# Patient Record
Sex: Male | Born: 1960 | Race: White | Hispanic: No | Marital: Married | Smoking: Never smoker
Health system: Southern US, Community
[De-identification: ages and names within clinical notes are randomized; demographics above are authoritative.]

## PROBLEM LIST (undated history)

## (undated) DIAGNOSIS — F329 Major depressive disorder, single episode, unspecified: Secondary | ICD-10-CM

## (undated) DIAGNOSIS — F32A Depression, unspecified: Secondary | ICD-10-CM

---

## 2006-07-18 ENCOUNTER — Ambulatory Visit (HOSPITAL_COMMUNITY): Admission: RE | Admit: 2006-07-18 | Discharge: 2006-07-18 | Payer: Self-pay | Admitting: General Surgery

## 2006-07-18 HISTORY — PX: UMBILICAL HERNIA REPAIR: SHX196

## 2007-02-21 ENCOUNTER — Emergency Department (HOSPITAL_COMMUNITY): Admission: EM | Admit: 2007-02-21 | Discharge: 2007-02-21 | Payer: Self-pay | Admitting: Family Medicine

## 2010-10-08 NOTE — Op Note (Signed)
NAMERECO, SHONK             ACCOUNT NO.:  [id number]   MEDICAL RECORD NO.:  [id number]          PATIENT TYPE:  AMB   LOCATION:  SDS                          FACILITY:  [facility]   PHYSICIAN:  Angelia Mould. [name], M.D.DATE OF BIRTH:  1960/10/19   DATE OF PROCEDURE:  07/18/2006  DATE OF DISCHARGE:                               OPERATIVE REPORT   PREOPERATIVE DIAGNOSIS:  Umbilical hernia.   POSTOPERATIVE DIAGNOSIS:  Umbilical hernia.   OPERATION PERFORMED:  Umbilical herniorrhaphy.   SURGEON:  Angelia Mould. [name], M.D.   OPERATIVE INDICATIONS:  This is a 50 year old white male who has noticed  a bulge and some discomfort in his umbilicus for about two months.  This  been no history of incarceration.  This has been bothering him more and  he desires to have this repaired.  On exam he has an umbilical hernia  with protrusion at the superior rim of the umbilicus.  I can not palpate  the defect.  It is a little bit too tender to completely reduce.  He is  brought to operating room electively.   OPERATIVE TECHNIQUE:  Following induction of a general LMA anesthetic,  the patient's abdomen was prepped and draped in a sterile fashion.  The  patient was identified as to correct patient and correct procedure.  Intravenous antibiotics were given prior to the incision.  0.5% Marcaine  with epinephrine local infiltration anesthetic.  A curved transverse  incision was made at the lower rim of the umbilicus.  Dissection was  carried down through subcutaneous tissue.  We dissected a very large  hernia sac away from the umbilical skin.  We dissected the fascia  circumferentially and define the edges of the fascia.  We dissected out  the hernia sac and opened this up and found it to be empty.  There is a  lot of fatty tissue around the hernia sac which we carefully debrided  with electrocautery.  We then could completely define the edges of the  fascial defect and it was no more than about 2-2.5 cm.   The fascia was  otherwise fairly sturdy.  We closed the fascia with interrupted sutures  of #1 Novofil.  At the corners simple sutures were placed and centrally  we placed two vest over pants sutures.  After all the sutures were  placed, we then lifted up on all the sutures to overlap the fascia and  tied all the sutures down.  This provided very secure overlapping  repair.  The wound was irrigated with saline.  Hemostasis was excellent.  The umbilical skin was tacked back to the abdominal wall fascia with 3-0  Vicryl suture.  Subcutaneous tissue was closed with interrupted sutures  of 3-0 Vicryl and skin closed with a running subcuticular suture of 4-0  Monocryl and Steri-Strips.  Clean bandages were placed and the patient  taken recovery room in stable condition.  Estimated blood loss was about  10-15 mL.  Complications none.  Sponge, needle and instrument counts  were correct.     Angelia Mould. [name], M.D.  Electronically Signed    HMI/MEDQ  D:  07/18/2006  T:  07/18/2006  Job:  [id number]   cc:   Theressa Millard, M.D.

## 2014-03-05 ENCOUNTER — Encounter (HOSPITAL_COMMUNITY): Payer: Self-pay

## 2014-03-05 ENCOUNTER — Emergency Department (HOSPITAL_COMMUNITY): Payer: BC Managed Care – PPO

## 2014-03-05 ENCOUNTER — Observation Stay (HOSPITAL_COMMUNITY): Payer: BC Managed Care – PPO | Admitting: Registered Nurse

## 2014-03-05 ENCOUNTER — Encounter (HOSPITAL_COMMUNITY): Payer: BC Managed Care – PPO | Admitting: Registered Nurse

## 2014-03-05 ENCOUNTER — Encounter (HOSPITAL_COMMUNITY): Admission: EM | Disposition: A | Discharge status: Home / Self Care | Payer: Self-pay | Source: Home / Self Care

## 2014-03-05 ENCOUNTER — Inpatient Hospital Stay (HOSPITAL_COMMUNITY)
Admission: EM | Admit: 2014-03-05 | Discharge: 2014-03-13 | DRG: 338 | Disposition: A | Discharge status: Home / Self Care | Payer: BC Managed Care – PPO | Attending: Surgery | Admitting: Surgery

## 2014-03-05 DIAGNOSIS — K567 Ileus, unspecified: Secondary | ICD-10-CM

## 2014-03-05 DIAGNOSIS — I471 Supraventricular tachycardia: Secondary | ICD-10-CM | POA: Diagnosis not present

## 2014-03-05 DIAGNOSIS — K352 Acute appendicitis with generalized peritonitis: Secondary | ICD-10-CM | POA: Diagnosis not present

## 2014-03-05 DIAGNOSIS — R069 Unspecified abnormalities of breathing: Secondary | ICD-10-CM

## 2014-03-05 DIAGNOSIS — M179 Osteoarthritis of knee, unspecified: Secondary | ICD-10-CM | POA: Diagnosis present

## 2014-03-05 DIAGNOSIS — E669 Obesity, unspecified: Secondary | ICD-10-CM

## 2014-03-05 DIAGNOSIS — J9690 Respiratory failure, unspecified, unspecified whether with hypoxia or hypercapnia: Secondary | ICD-10-CM

## 2014-03-05 DIAGNOSIS — K913 Postprocedural intestinal obstruction: Secondary | ICD-10-CM | POA: Diagnosis not present

## 2014-03-05 DIAGNOSIS — G4733 Obstructive sleep apnea (adult) (pediatric): Secondary | ICD-10-CM | POA: Diagnosis present

## 2014-03-05 DIAGNOSIS — Y839 Surgical procedure, unspecified as the cause of abnormal reaction of the patient, or of later complication, without mention of misadventure at the time of the procedure: Secondary | ICD-10-CM | POA: Diagnosis not present

## 2014-03-05 DIAGNOSIS — I4891 Unspecified atrial fibrillation: Secondary | ICD-10-CM | POA: Diagnosis not present

## 2014-03-05 DIAGNOSIS — M7989 Other specified soft tissue disorders: Secondary | ICD-10-CM | POA: Diagnosis present

## 2014-03-05 DIAGNOSIS — K3532 Acute appendicitis with perforation and localized peritonitis, without abscess: Secondary | ICD-10-CM

## 2014-03-05 DIAGNOSIS — R0902 Hypoxemia: Secondary | ICD-10-CM

## 2014-03-05 DIAGNOSIS — R1031 Right lower quadrant pain: Secondary | ICD-10-CM | POA: Diagnosis not present

## 2014-03-05 DIAGNOSIS — Z6835 Body mass index (BMI) 35.0-35.9, adult: Secondary | ICD-10-CM

## 2014-03-05 DIAGNOSIS — F329 Major depressive disorder, single episode, unspecified: Secondary | ICD-10-CM | POA: Diagnosis present

## 2014-03-05 DIAGNOSIS — K3589 Other acute appendicitis without perforation or gangrene: Secondary | ICD-10-CM

## 2014-03-05 DIAGNOSIS — J9601 Acute respiratory failure with hypoxia: Secondary | ICD-10-CM

## 2014-03-05 DIAGNOSIS — Z881 Allergy status to other antibiotic agents status: Secondary | ICD-10-CM

## 2014-03-05 DIAGNOSIS — K9189 Other postprocedural complications and disorders of digestive system: Secondary | ICD-10-CM

## 2014-03-05 DIAGNOSIS — R609 Edema, unspecified: Secondary | ICD-10-CM

## 2014-03-05 DIAGNOSIS — R0602 Shortness of breath: Secondary | ICD-10-CM

## 2014-03-05 DIAGNOSIS — J9811 Atelectasis: Secondary | ICD-10-CM

## 2014-03-05 DIAGNOSIS — Q430 Meckel's diverticulum (displaced) (hypertrophic): Secondary | ICD-10-CM

## 2014-03-05 DIAGNOSIS — E876 Hypokalemia: Secondary | ICD-10-CM | POA: Diagnosis not present

## 2014-03-05 DIAGNOSIS — K3580 Unspecified acute appendicitis: Secondary | ICD-10-CM | POA: Diagnosis present

## 2014-03-05 DIAGNOSIS — J9600 Acute respiratory failure, unspecified whether with hypoxia or hypercapnia: Secondary | ICD-10-CM

## 2014-03-05 HISTORY — PX: LAPAROSCOPIC APPENDECTOMY: SHX408

## 2014-03-05 HISTORY — DX: Major depressive disorder, single episode, unspecified: F32.9

## 2014-03-05 HISTORY — DX: Depression, unspecified: F32.A

## 2014-03-05 LAB — CBC WITH DIFFERENTIAL/PLATELET
BASOS ABS: 0 10*3/uL (ref 0.0–0.1)
BASOS PCT: 0 % (ref 0–1)
EOS ABS: 0 10*3/uL (ref 0.0–0.7)
Eosinophils Relative: 0 % (ref 0–5)
HEMATOCRIT: 42.6 % (ref 39.0–52.0)
Hemoglobin: 15.2 g/dL (ref 13.0–17.0)
LYMPHS ABS: 0.9 10*3/uL (ref 0.7–4.0)
LYMPHS PCT: 5 % — AB (ref 12–46)
MCH: 31.8 pg (ref 26.0–34.0)
MCHC: 35.7 g/dL (ref 30.0–36.0)
MCV: 89.1 fL (ref 78.0–100.0)
Monocytes Absolute: 1.4 10*3/uL — ABNORMAL HIGH (ref 0.1–1.0)
Monocytes Relative: 7 % (ref 3–12)
NEUTROS ABS: 16.4 10*3/uL — AB (ref 1.7–7.7)
Neutrophils Relative %: 88 % — ABNORMAL HIGH (ref 43–77)
Platelets: 287 10*3/uL (ref 150–400)
RBC: 4.78 MIL/uL (ref 4.22–5.81)
RDW: 12.2 % (ref 11.5–15.5)
WBC: 18.6 10*3/uL — ABNORMAL HIGH (ref 4.0–10.5)

## 2014-03-05 LAB — URINALYSIS, ROUTINE W REFLEX MICROSCOPIC
BILIRUBIN URINE: NEGATIVE
GLUCOSE, UA: NEGATIVE mg/dL
HGB URINE DIPSTICK: NEGATIVE
Ketones, ur: NEGATIVE mg/dL
Leukocytes, UA: NEGATIVE
NITRITE: NEGATIVE
PH: 5.5 (ref 5.0–8.0)
PROTEIN: NEGATIVE mg/dL
Specific Gravity, Urine: >1.046 — ABNORMAL HIGH (ref 1.005–1.030)
UROBILINOGEN UA: 0.2 mg/dL (ref 0.0–1.0)

## 2014-03-05 LAB — COMPREHENSIVE METABOLIC PANEL
ALT: 30 U/L (ref 0–53)
ANION GAP: 17 — AB (ref 5–15)
AST: 19 U/L (ref 0–37)
Albumin: 4.2 g/dL (ref 3.5–5.2)
Alkaline Phosphatase: 98 U/L (ref 39–117)
BILIRUBIN TOTAL: 0.9 mg/dL (ref 0.3–1.2)
BUN: 13 mg/dL (ref 6–23)
CALCIUM: 9.3 mg/dL (ref 8.4–10.5)
CHLORIDE: 101 meq/L (ref 96–112)
CO2: 23 mEq/L (ref 19–32)
CREATININE: 1.02 mg/dL (ref 0.50–1.35)
GFR calc Af Amer: >90 mL/min (ref >90)
GFR calc non Af Amer: 83 mL/min — ABNORMAL LOW (ref >90)
GLUCOSE: 172 mg/dL — AB (ref 70–99)
POTASSIUM: 4.3 meq/L (ref 3.7–5.3)
SODIUM: 141 meq/L (ref 137–147)
Total Protein: 8.2 g/dL (ref 6.0–8.3)

## 2014-03-05 LAB — LIPASE, BLOOD: LIPASE: 23 U/L (ref 11–59)

## 2014-03-05 SURGERY — APPENDECTOMY, LAPAROSCOPIC
Anesthesia: General | Site: Abdomen

## 2014-03-05 MED ORDER — LIDOCAINE HCL (CARDIAC) 20 MG/ML IV SOLN
INTRAVENOUS | Status: DC | PRN
  Administered 2014-03-05: 100 mg via INTRAVENOUS

## 2014-03-05 MED ORDER — SODIUM CHLORIDE 0.9 % IJ SOLN
INTRAMUSCULAR | Status: AC
  Administered 2014-03-05: 12:00:00
  Filled 2014-03-05: qty 3

## 2014-03-05 MED ORDER — METOPROLOL TARTRATE 1 MG/ML IV SOLN
5.0000 mg | Freq: Four times a day (QID) | INTRAVENOUS | Status: DC | PRN
  Administered 2014-03-10: 5 mg via INTRAVENOUS
  Filled 2014-03-05 (×3): qty 5

## 2014-03-05 MED ORDER — BUPIVACAINE-EPINEPHRINE 0.25% -1:200000 IJ SOLN
INTRAMUSCULAR | Status: AC
  Filled 2014-03-05: qty 1

## 2014-03-05 MED ORDER — IOHEXOL 300 MG/ML  SOLN
50.0000 mL | Freq: Once | INTRAMUSCULAR | Status: AC | PRN
  Administered 2014-03-05: 50 mL via ORAL

## 2014-03-05 MED ORDER — FENTANYL CITRATE 0.05 MG/ML IJ SOLN
INTRAMUSCULAR | Status: AC
  Administered 2014-03-05: 11:00:00
  Filled 2014-03-05: qty 2

## 2014-03-05 MED ORDER — ONDANSETRON HCL 4 MG/2ML IJ SOLN
4.0000 mg | Freq: Once | INTRAMUSCULAR | Status: AC
  Administered 2014-03-05: 4 mg via INTRAVENOUS
  Filled 2014-03-05: qty 2

## 2014-03-05 MED ORDER — IOHEXOL 300 MG/ML  SOLN
100.0000 mL | Freq: Once | INTRAMUSCULAR | Status: AC | PRN
  Administered 2014-03-05: 100 mL via INTRAVENOUS

## 2014-03-05 MED ORDER — FENTANYL CITRATE 0.05 MG/ML IJ SOLN
INTRAMUSCULAR | Status: AC
  Filled 2014-03-05: qty 5

## 2014-03-05 MED ORDER — PIPERACILLIN-TAZOBACTAM 3.375 G IVPB: 3.3750 g | Freq: Four times a day (QID) | INTRAVENOUS | Status: DC

## 2014-03-05 MED ORDER — FENTANYL CITRATE 0.05 MG/ML IJ SOLN
25.0000 ug | INTRAMUSCULAR | Status: DC | PRN
  Administered 2014-03-05 (×3): 25 ug via INTRAVENOUS

## 2014-03-05 MED ORDER — HYDROCODONE-ACETAMINOPHEN 5-325 MG PO TABS: 1.0000 | ORAL_TABLET | ORAL | Status: DC | PRN

## 2014-03-05 MED ORDER — LIP MEDEX EX OINT
1.0000 "application " | TOPICAL_OINTMENT | Freq: Two times a day (BID) | CUTANEOUS | Status: DC
  Administered 2014-03-05 – 2014-03-12 (×15): 1 via TOPICAL
  Filled 2014-03-05 (×5): qty 7

## 2014-03-05 MED ORDER — MENTHOL 3 MG MT LOZG: 1.0000 | LOZENGE | OROMUCOSAL | Status: DC | PRN

## 2014-03-05 MED ORDER — ACETAMINOPHEN 325 MG PO TABS
ORAL_TABLET | ORAL | Status: AC
  Filled 2014-03-05: qty 2

## 2014-03-05 MED ORDER — PIPERACILLIN-TAZOBACTAM 3.375 G IVPB
3.3750 g | Freq: Once | INTRAVENOUS | Status: AC
  Administered 2014-03-05: 3.375 g via INTRAVENOUS
  Filled 2014-03-05: qty 50

## 2014-03-05 MED ORDER — PROMETHAZINE HCL 25 MG/ML IJ SOLN: 6.2500 mg | INTRAMUSCULAR | Status: DC | PRN

## 2014-03-05 MED ORDER — HYDROMORPHONE HCL 1 MG/ML IJ SOLN
0.5000 mg | INTRAMUSCULAR | Status: DC | PRN
  Administered 2014-03-05 – 2014-03-07 (×6): 1 mg via INTRAVENOUS
  Administered 2014-03-07: 2 mg via INTRAVENOUS
  Administered 2014-03-07: 1 mg via INTRAVENOUS
  Administered 2014-03-07: 2 mg via INTRAVENOUS
  Administered 2014-03-08: 1 mg via INTRAVENOUS
  Administered 2014-03-09 (×2): 2 mg via INTRAVENOUS
  Administered 2014-03-09 – 2014-03-10 (×2): 1 mg via INTRAVENOUS
  Filled 2014-03-05 (×5): qty 1
  Filled 2014-03-05 (×2): qty 2
  Filled 2014-03-05 (×2): qty 1
  Filled 2014-03-05: qty 2
  Filled 2014-03-05: qty 1
  Filled 2014-03-05 (×2): qty 2
  Filled 2014-03-05: qty 1

## 2014-03-05 MED ORDER — ROCURONIUM BROMIDE 100 MG/10ML IV SOLN
INTRAVENOUS | Status: DC | PRN
  Administered 2014-03-05: 10 mg via INTRAVENOUS
  Administered 2014-03-05: 40 mg via INTRAVENOUS

## 2014-03-05 MED ORDER — ONDANSETRON HCL 4 MG/2ML IJ SOLN
INTRAMUSCULAR | Status: DC | PRN
  Administered 2014-03-05: 4 mg via INTRAVENOUS

## 2014-03-05 MED ORDER — LACTATED RINGERS IV SOLN: INTRAVENOUS | Status: DC

## 2014-03-05 MED ORDER — PROPOFOL 10 MG/ML IV BOLUS
INTRAVENOUS | Status: AC
  Filled 2014-03-05: qty 20

## 2014-03-05 MED ORDER — OXYCODONE HCL 5 MG PO TABS: 5.0000 mg | ORAL_TABLET | ORAL | Status: DC | PRN

## 2014-03-05 MED ORDER — PROMETHAZINE HCL 25 MG/ML IJ SOLN
6.2500 mg | INTRAMUSCULAR | Status: DC | PRN
  Administered 2014-03-06: 6.25 mg via INTRAVENOUS
  Administered 2014-03-09 (×2): 12.5 mg via INTRAVENOUS
  Filled 2014-03-05 (×4): qty 1

## 2014-03-05 MED ORDER — DIPHENHYDRAMINE HCL 50 MG PO CAPS
50.0000 mg | ORAL_CAPSULE | Freq: Every evening | ORAL | Status: DC | PRN
  Administered 2014-03-06: 50 mg via ORAL
  Filled 2014-03-05: qty 1

## 2014-03-05 MED ORDER — GLYCOPYRROLATE 0.2 MG/ML IJ SOLN
INTRAMUSCULAR | Status: DC | PRN
  Administered 2014-03-05: .8 mg via INTRAVENOUS

## 2014-03-05 MED ORDER — MAGIC MOUTHWASH: 15.0000 mL | Freq: Four times a day (QID) | ORAL | Status: DC | PRN

## 2014-03-05 MED ORDER — ATROPINE SULFATE 0.4 MG/ML IJ SOLN
INTRAMUSCULAR | Status: AC
  Filled 2014-03-05: qty 2

## 2014-03-05 MED ORDER — SODIUM CHLORIDE 0.9 % IV BOLUS (SEPSIS)
1000.0000 mL | Freq: Once | INTRAVENOUS | Status: AC
  Administered 2014-03-05: 1000 mL via INTRAVENOUS

## 2014-03-05 MED ORDER — ALUM & MAG HYDROXIDE-SIMETH 200-200-20 MG/5ML PO SUSP: 30.0000 mL | Freq: Four times a day (QID) | ORAL | Status: DC | PRN

## 2014-03-05 MED ORDER — ACETAMINOPHEN 650 MG RE SUPP: 650.0000 mg | Freq: Four times a day (QID) | RECTAL | Status: DC | PRN

## 2014-03-05 MED ORDER — SUCCINYLCHOLINE CHLORIDE 20 MG/ML IJ SOLN
INTRAMUSCULAR | Status: DC | PRN
  Administered 2014-03-05: 100 mg via INTRAVENOUS

## 2014-03-05 MED ORDER — LACTATED RINGERS IV SOLN
INTRAVENOUS | Status: DC | PRN
  Administered 2014-03-05: 09:00:00 via INTRAVENOUS

## 2014-03-05 MED ORDER — NEOSTIGMINE METHYLSULFATE 10 MG/10ML IV SOLN
INTRAVENOUS | Status: AC
  Filled 2014-03-05: qty 1

## 2014-03-05 MED ORDER — DIPHENHYDRAMINE HCL 50 MG/ML IJ SOLN: 12.5000 mg | Freq: Four times a day (QID) | INTRAMUSCULAR | Status: DC | PRN

## 2014-03-05 MED ORDER — BUPIVACAINE-EPINEPHRINE 0.25% -1:200000 IJ SOLN
INTRAMUSCULAR | Status: DC | PRN
  Administered 2014-03-05: 50 mL

## 2014-03-05 MED ORDER — MORPHINE SULFATE 4 MG/ML IJ SOLN
4.0000 mg | Freq: Once | INTRAMUSCULAR | Status: AC
  Administered 2014-03-05: 4 mg via INTRAVENOUS
  Filled 2014-03-05: qty 1

## 2014-03-05 MED ORDER — SODIUM CHLORIDE 0.9 % IJ SOLN
INTRAMUSCULAR | Status: AC
  Filled 2014-03-05: qty 10

## 2014-03-05 MED ORDER — ROCURONIUM BROMIDE 100 MG/10ML IV SOLN
INTRAVENOUS | Status: AC
  Filled 2014-03-05: qty 1

## 2014-03-05 MED ORDER — KETOROLAC TROMETHAMINE 30 MG/ML IJ SOLN
INTRAMUSCULAR | Status: AC
  Filled 2014-03-05: qty 1

## 2014-03-05 MED ORDER — POTASSIUM CHLORIDE IN NACL 20-0.9 MEQ/L-% IV SOLN
INTRAVENOUS | Status: DC
  Administered 2014-03-05 – 2014-03-07 (×3): via INTRAVENOUS
  Administered 2014-03-07: 10 mL/h via INTRAVENOUS
  Administered 2014-03-09 – 2014-03-12 (×2): via INTRAVENOUS
  Filled 2014-03-05 (×6): qty 1000

## 2014-03-05 MED ORDER — DIPHENHYDRAMINE HCL 12.5 MG/5ML PO ELIX: 12.5000 mg | ORAL_SOLUTION | Freq: Four times a day (QID) | ORAL | Status: DC | PRN

## 2014-03-05 MED ORDER — NEOSTIGMINE METHYLSULFATE 10 MG/10ML IV SOLN
INTRAVENOUS | Status: DC | PRN
  Administered 2014-03-05: 5 mg via INTRAVENOUS

## 2014-03-05 MED ORDER — MIDAZOLAM HCL 5 MG/5ML IJ SOLN
INTRAMUSCULAR | Status: DC | PRN
  Administered 2014-03-05: 1 mg via INTRAVENOUS

## 2014-03-05 MED ORDER — FENTANYL CITRATE 0.05 MG/ML IJ SOLN
INTRAMUSCULAR | Status: AC
  Filled 2014-03-05: qty 2

## 2014-03-05 MED ORDER — PHENYLEPHRINE HCL 10 MG/ML IJ SOLN
INTRAMUSCULAR | Status: DC | PRN
  Administered 2014-03-05 (×2): 80 ug via INTRAVENOUS
  Administered 2014-03-05: 40 ug via INTRAVENOUS

## 2014-03-05 MED ORDER — CITALOPRAM HYDROBROMIDE 20 MG PO TABS: 20.0000 mg | ORAL_TABLET | Freq: Every day | ORAL | Status: DC

## 2014-03-05 MED ORDER — METRONIDAZOLE IN NACL 5-0.79 MG/ML-% IV SOLN
500.0000 mg | Freq: Four times a day (QID) | INTRAVENOUS | Status: DC
  Administered 2014-03-05 – 2014-03-13 (×32): 500 mg via INTRAVENOUS
  Filled 2014-03-05 (×36): qty 100

## 2014-03-05 MED ORDER — KETOROLAC TROMETHAMINE 30 MG/ML IJ SOLN
INTRAMUSCULAR | Status: DC | PRN
  Administered 2014-03-05: 30 mg via INTRAVENOUS

## 2014-03-05 MED ORDER — PHENOL 1.4 % MT LIQD: 2.0000 | OROMUCOSAL | Status: DC | PRN

## 2014-03-05 MED ORDER — LACTATED RINGERS IV BOLUS (SEPSIS): 1000.0000 mL | Freq: Three times a day (TID) | INTRAVENOUS | Status: AC | PRN

## 2014-03-05 MED ORDER — GLYCOPYRROLATE 0.2 MG/ML IJ SOLN
INTRAMUSCULAR | Status: AC
  Filled 2014-03-05: qty 4

## 2014-03-05 MED ORDER — ALBUTEROL SULFATE (2.5 MG/3ML) 0.083% IN NEBU
INHALATION_SOLUTION | RESPIRATORY_TRACT | Status: AC
  Filled 2014-03-05: qty 3

## 2014-03-05 MED ORDER — IBUPROFEN-DIPHENHYDRAMINE HCL 200-25 MG PO CAPS
2.0000 | ORAL_CAPSULE | Freq: Every evening | ORAL | Status: DC | PRN
Start: 2014-03-05 — End: 2014-03-05

## 2014-03-05 MED ORDER — ONDANSETRON HCL 4 MG/2ML IJ SOLN
INTRAMUSCULAR | Status: AC
  Filled 2014-03-05: qty 2

## 2014-03-05 MED ORDER — FENTANYL CITRATE 0.05 MG/ML IJ SOLN
INTRAMUSCULAR | Status: DC | PRN
  Administered 2014-03-05: 100 ug via INTRAVENOUS
  Administered 2014-03-05 (×4): 50 ug via INTRAVENOUS

## 2014-03-05 MED ORDER — MORPHINE SULFATE 2 MG/ML IJ SOLN
1.0000 mg | INTRAMUSCULAR | Status: DC | PRN
  Administered 2014-03-08 (×2): 4 mg via INTRAVENOUS
  Administered 2014-03-08: 2 mg via INTRAVENOUS
  Filled 2014-03-05 (×3): qty 2
  Filled 2014-03-05: qty 1

## 2014-03-05 MED ORDER — DEXTROSE 5 % IV SOLN
2.0000 g | INTRAVENOUS | Status: DC
  Administered 2014-03-05 – 2014-03-12 (×8): 2 g via INTRAVENOUS
  Filled 2014-03-05 (×10): qty 2

## 2014-03-05 MED ORDER — IBUPROFEN 200 MG PO TABS: 400.0000 mg | ORAL_TABLET | Freq: Four times a day (QID) | ORAL | Status: DC | PRN

## 2014-03-05 MED ORDER — LIDOCAINE HCL (CARDIAC) 20 MG/ML IV SOLN
INTRAVENOUS | Status: AC
  Filled 2014-03-05: qty 5

## 2014-03-05 MED ORDER — ACETAMINOPHEN 325 MG PO TABS
325.0000 mg | ORAL_TABLET | Freq: Four times a day (QID) | ORAL | Status: DC | PRN
  Administered 2014-03-05: 325 mg via ORAL

## 2014-03-05 MED ORDER — PHENYLEPHRINE 40 MCG/ML (10ML) SYRINGE FOR IV PUSH (FOR BLOOD PRESSURE SUPPORT)
PREFILLED_SYRINGE | INTRAVENOUS | Status: AC
  Filled 2014-03-05: qty 10

## 2014-03-05 MED ORDER — ALBUTEROL SULFATE (2.5 MG/3ML) 0.083% IN NEBU
2.5000 mg | INHALATION_SOLUTION | Freq: Four times a day (QID) | RESPIRATORY_TRACT | Status: DC | PRN
  Administered 2014-03-05: 2.5 mg via RESPIRATORY_TRACT

## 2014-03-05 MED ORDER — AMOXICILLIN-POT CLAVULANATE 875-125 MG PO TABS: 1.0000 | ORAL_TABLET | Freq: Two times a day (BID) | ORAL | Status: DC

## 2014-03-05 MED ORDER — LACTATED RINGERS IR SOLN
Status: DC | PRN
  Administered 2014-03-05 (×2): 3000 mL

## 2014-03-05 MED ORDER — BISACODYL 10 MG RE SUPP: 10.0000 mg | Freq: Two times a day (BID) | RECTAL | Status: DC | PRN

## 2014-03-05 MED ORDER — MEPERIDINE HCL 50 MG/ML IJ SOLN: 6.2500 mg | INTRAMUSCULAR | Status: DC | PRN

## 2014-03-05 MED ORDER — ONDANSETRON HCL 4 MG/2ML IJ SOLN
4.0000 mg | Freq: Four times a day (QID) | INTRAMUSCULAR | Status: DC | PRN
  Administered 2014-03-05 – 2014-03-11 (×7): 4 mg via INTRAVENOUS
  Filled 2014-03-05 (×7): qty 2

## 2014-03-05 MED ORDER — EPHEDRINE SULFATE 50 MG/ML IJ SOLN
INTRAMUSCULAR | Status: AC
  Filled 2014-03-05: qty 1

## 2014-03-05 MED ORDER — CITALOPRAM HYDROBROMIDE 20 MG PO TABS
20.0000 mg | ORAL_TABLET | Freq: Every day | ORAL | Status: DC
  Administered 2014-03-05 – 2014-03-13 (×9): 20 mg via ORAL
  Filled 2014-03-05 (×10): qty 1

## 2014-03-05 MED ORDER — PROPOFOL 10 MG/ML IV BOLUS
INTRAVENOUS | Status: DC | PRN
  Administered 2014-03-05: 200 mg via INTRAVENOUS

## 2014-03-05 MED ORDER — OXYCODONE HCL 5 MG PO TABS
5.0000 mg | ORAL_TABLET | ORAL | Status: DC | PRN
  Administered 2014-03-08 – 2014-03-12 (×6): 10 mg via ORAL
  Filled 2014-03-05 (×6): qty 2

## 2014-03-05 MED ORDER — MIDAZOLAM HCL 2 MG/2ML IJ SOLN
INTRAMUSCULAR | Status: AC
  Filled 2014-03-05: qty 2

## 2014-03-05 SURGICAL SUPPLY — 40 items
APPLIER CLIP 5 13 M/L LIGAMAX5 (MISCELLANEOUS)
APPLIER CLIP ROT 10 11.4 M/L (STAPLE)
APR CLP MED LRG 11.4X10 (STAPLE)
APR CLP MED LRG 5 ANG JAW (MISCELLANEOUS)
BAG SPEC RTRVL LRG 6X4 10 (ENDOMECHANICALS) ×1
CLIP APPLIE 5 13 M/L LIGAMAX5 (MISCELLANEOUS) IMPLANT
CLIP APPLIE ROT 10 11.4 M/L (STAPLE) IMPLANT
CUTTER FLEX LINEAR 45M (STAPLE) IMPLANT
DECANTER SPIKE VIAL GLASS SM (MISCELLANEOUS) ×3 IMPLANT
DEVICE TROCAR PUNCTURE CLOSURE (ENDOMECHANICALS) ×3 IMPLANT
DRAPE LAPAROSCOPIC ABDOMINAL (DRAPES) ×3 IMPLANT
DRAPE WARM FLUID 44X44 (DRAPE) ×3 IMPLANT
DRSG TEGADERM 2-3/8X2-3/4 SM (GAUZE/BANDAGES/DRESSINGS) ×6 IMPLANT
DRSG TEGADERM 4X4.75 (GAUZE/BANDAGES/DRESSINGS) ×3 IMPLANT
ELECT REM PT RETURN 9FT ADLT (ELECTROSURGICAL) ×3
ELECTRODE REM PT RTRN 9FT ADLT (ELECTROSURGICAL) ×1 IMPLANT
ENDOLOOP SUT PDS II  0 18 (SUTURE) ×2
ENDOLOOP SUT PDS II 0 18 (SUTURE) ×1 IMPLANT
GLOVE ECLIPSE 8.0 STRL XLNG CF (GLOVE) ×6 IMPLANT
GLOVE INDICATOR 8.0 STRL GRN (GLOVE) ×6 IMPLANT
GOWN STRL REUS W/TWL XL LVL3 (GOWN DISPOSABLE) ×9 IMPLANT
KIT BASIN OR (CUSTOM PROCEDURE TRAY) ×3 IMPLANT
PEN SKIN MARKING BROAD (MISCELLANEOUS) ×3 IMPLANT
POUCH SPECIMEN RETRIEVAL 10MM (ENDOMECHANICALS) ×3 IMPLANT
RELOAD 45 VASCULAR/THIN (ENDOMECHANICALS) IMPLANT
RELOAD BLUE (STAPLE) ×6 IMPLANT
RELOAD STAPLE TA45 3.5 REG BLU (ENDOMECHANICALS) IMPLANT
SET IRRIG TUBING LAPAROSCOPIC (IRRIGATION / IRRIGATOR) ×3 IMPLANT
SHEARS HARMONIC ACE PLUS 36CM (ENDOMECHANICALS) IMPLANT
SLEEVE XCEL OPT CAN 5 100 (ENDOMECHANICALS) ×3 IMPLANT
STAPLE ECHEON FLEX 60 POW ENDO (STAPLE) ×3 IMPLANT
STAPLER VISISTAT 35W (STAPLE) ×3 IMPLANT
SUT MNCRL AB 4-0 PS2 18 (SUTURE) ×3 IMPLANT
SUT SILK 2 0 SH (SUTURE) ×3 IMPLANT
TOWEL OR 17X26 10 PK STRL BLUE (TOWEL DISPOSABLE) ×3 IMPLANT
TOWEL OR NON WOVEN STRL DISP B (DISPOSABLE) ×3 IMPLANT
TRAY FOLEY CATH 14FRSI W/METER (CATHETERS) ×3 IMPLANT
TRAY LAPAROSCOPIC (CUSTOM PROCEDURE TRAY) ×3 IMPLANT
TROCAR BLADELESS OPT 5 100 (ENDOMECHANICALS) ×3 IMPLANT
TROCAR XCEL 12X100 BLDLESS (ENDOMECHANICALS) ×3 IMPLANT

## 2014-03-05 NOTE — Anesthesia Postprocedure Evaluation (Signed)
  Anesthesia Post-op Note  Patient: Ernest Riley A Burley  Procedure(s) Performed: Procedure(s) (LRB): APPENDECTOMY LAPAROSCOPIC (N/A)  Patient Location: PACU  Anesthesia Type: General  Level of Consciousness: awake and alert   Airway and Oxygen Therapy: Patient Spontanous Breathing  Post-op Pain: mild  Post-op Assessment: Post-op Vital signs reviewed, Patient's Cardiovascular Status Stable, Respiratory Function Stable, Patent Airway and No signs of Nausea or vomiting  Last Vitals:  Filed Vitals:   03/05/14 1130  BP: 100/63  Pulse: 100  Temp:   Resp: 20    Post-op Vital Signs: stable   Complications: No apparent anesthesia complications

## 2014-03-05 NOTE — H&P (Signed)
Plan lap appy.  Pt agrees  The anatomy & physiology of the digestive tract was discussed.  The pathophysiology of appendicitis and other appendiceal disorders were discussed.  Natural history risks without surgery was discussed.   I feel the risks of no intervention will lead to serious problems that outweigh the operative risks; therefore, I recommended diagnostic laparoscopy with removal of appendix to remove the pathology.  Laparoscopic & open techniques were discussed.   I noted a good likelihood this will help address the problem.   Risks such as bleeding, infection, abscess, leak, reoperation, possible ostomy, hernia, heart attack, death, and other risks were discussed.  Goals of post-operative recovery were discussed as well.  We will work to minimize complications.  Questions were answered.  The patient expresses understanding & wishes to proceed with surgery.  Ardeth SportsmanSteven C. Shahara Hartsfield, M.D., F.A.C.S. Gastrointestinal and Minimally Invasive Surgery [facility] [address] [place], [place] [address] [phone] Main / Paging

## 2014-03-05 NOTE — Progress Notes (Signed)
Called by RN to check Pt for low (89%) Sp02. Pt asleep. Placed Pt on 50% Venturi Mask. Sp02 = 90%. RN at bedside and will call RT with any concerns.

## 2014-03-05 NOTE — Anesthesia Preprocedure Evaluation (Addendum)
Anesthesia Evaluation  Patient identified by MRN, date of birth, ID band Patient awake    Reviewed: Allergy & Precautions, H&P , NPO status , Patient's Chart, lab work & pertinent test results  Airway Mallampati: II TM Distance: >3 FB Neck ROM: Full    Dental no notable dental hx.    Pulmonary neg pulmonary ROS,  breath sounds clear to auscultation  Pulmonary exam normal       Cardiovascular negative cardio ROS  Rhythm:Regular Rate:Normal     Neuro/Psych negative neurological ROS  negative psych ROS   GI/Hepatic negative GI ROS, Neg liver ROS,   Endo/Other  negative endocrine ROS  Renal/GU negative Renal ROS  negative genitourinary   Musculoskeletal negative musculoskeletal ROS (+)   Abdominal   Peds negative pediatric ROS (+)  Hematology negative hematology ROS (+)   Anesthesia Other Findings   Reproductive/Obstetrics negative OB ROS                           Anesthesia Physical Anesthesia Plan  ASA: I and emergent  Anesthesia Plan: General   Post-op Pain Management:    Induction: Intravenous, Rapid sequence and Cricoid pressure planned  Airway Management Planned: Oral ETT  Additional Equipment:   Intra-op Plan:   Post-operative Plan: Extubation in OR  Informed Consent: I have reviewed the patients History and Physical, chart, labs and discussed the procedure including the risks, benefits and alternatives for the proposed anesthesia with the patient or authorized representative who has indicated his/her understanding and acceptance.   Dental advisory given  Plan Discussed with: CRNA  Anesthesia Plan Comments:         Anesthesia Quick Evaluation  

## 2014-03-05 NOTE — ED Notes (Signed)
Abdominal pain started at 3 pm on October 13,  Took Motrin,  Took hot shower felt some better,  Ate a bagel and began to feel worse again

## 2014-03-05 NOTE — H&P (Signed)
[facility] Admission Note  DRE GAMINO Mar 20, 1961  [id number].    Requesting MD: Dr. Lita Mains Chief Complaint/Reason for Consult: RLQ abdominal pain, appendicitis  HPI:  53 y/o white male with h/o Depression and h/o umbilical hernia repair by Dr. Dalbert Batman on 07/18/2006 who presents to [facility] with right sided abdominal pain which radiates to the RLQ.  The pain started at 3pm on 03/04/14 and has progressively worsened as the night went on.  Around 1am he was brought to the ER due to 10/10 severe pain.  Sitting up makes the pain worse, going over bumps on the ride her exacerbates the pain.  No other precipitating or alleviating factors.  He denies any nausea/vomiting/diarrhea.  Notes some sweating without definite fever.  No CP/SOB, fever/chills, trouble urinating.  Last BM was yesterday am.  His pain is now at 7/10 with morphine.  Denies any other gastrointestinal problems.  Never had pain like this before.   ROS: All systems reviewed and otherwise negative except for as above  No family history on file.  Past Medical History  Diagnosis Date  . Depression     Past Surgical History  Procedure Laterality Date  . Umbilical hernia repair  2007/8    Dr. Dalbert Batman    Social History:  has no tobacco, alcohol, and drug history on file.  Allergies:  Allergies  Allergen Reactions  . Sulfa Antibiotics Swelling     (Not in a hospital admission)  Blood pressure 135/87, pulse 108, temperature 100 F (37.8 C), temperature source Oral, resp. rate 20, SpO2 92.00%. Physical Exam: General: pleasant, WD/WN white male who is laying in bed in NAD HEENT: head is normocephalic, atraumatic.  Sclera are noninjected.  PERRL.  Ears and nose without any masses or lesions.  Mouth is pink and moist Heart: regular, rate, and rhythm.  No obvious murmurs, gallops, or rubs noted.  Palpable pedal pulses bilaterally Lungs: CTAB, no wheezes, rhonchi, or rales noted.  Respiratory effort  nonlabored Abd: soft, ND, tender in RUQ and RLQ, positive Rovsing sign, pain at Mcburney's point, localized guarding in RLQ, +BS, no masses, hernias, or organomegaly, infraumbilical scar noted (from previous hernia surgery) MS: all 4 extremities are symmetrical with no cyanosis, clubbing, or edema. Skin: warm and dry with no masses, lesions, or rashes Psych: A&Ox3 with an appropriate affect.   Results for orders placed during the hospital encounter of 03/05/14 (from the past 48 hour(s))  CBC WITH DIFFERENTIAL     Status: Abnormal   Collection Time    03/05/14  3:59 AM      Result Value Ref Range   WBC 18.6 (*) 4.0 - 10.5 K/uL   RBC 4.78  4.22 - 5.81 MIL/uL   Hemoglobin 15.2  13.0 - 17.0 g/dL   HCT 42.6  39.0 - 52.0 %   MCV 89.1  78.0 - 100.0 fL   MCH 31.8  26.0 - 34.0 pg   MCHC 35.7  30.0 - 36.0 g/dL   RDW 12.2  11.5 - 15.5 %   Platelets 287  150 - 400 K/uL   Neutrophils Relative % 88 (*) 43 - 77 %   Neutro Abs 16.4 (*) 1.7 - 7.7 K/uL   Lymphocytes Relative 5 (*) 12 - 46 %   Lymphs Abs 0.9  0.7 - 4.0 K/uL   Monocytes Relative 7  3 - 12 %   Monocytes Absolute 1.4 (*) 0.1 - 1.0 K/uL   Eosinophils Relative 0  0 - 5 %  Eosinophils Absolute 0.0  0.0 - 0.7 K/uL   Basophils Relative 0  0 - 1 %   Basophils Absolute 0.0  0.0 - 0.1 K/uL  COMPREHENSIVE METABOLIC PANEL     Status: Abnormal   Collection Time    03/05/14  3:59 AM      Result Value Ref Range   Sodium 141  137 - 147 mEq/L   Potassium 4.3  3.7 - 5.3 mEq/L   Chloride 101  96 - 112 mEq/L   CO2 23  19 - 32 mEq/L   Glucose, Bld 172 (*) 70 - 99 mg/dL   BUN 13  6 - 23 mg/dL   Creatinine, Ser 1.02  0.50 - 1.35 mg/dL   Calcium 9.3  8.4 - 10.5 mg/dL   Total Protein 8.2  6.0 - 8.3 g/dL   Albumin 4.2  3.5 - 5.2 g/dL   AST 19  0 - 37 U/L   ALT 30  0 - 53 U/L   Alkaline Phosphatase 98  39 - 117 U/L   Total Bilirubin 0.9  0.3 - 1.2 mg/dL   GFR calc non Af Amer 83 (*) >90 mL/min   GFR calc Af Amer >90  >90 mL/min   Comment:  (NOTE)     The eGFR has been calculated using the CKD EPI equation.     This calculation has not been validated in all clinical situations.     eGFR's persistently <90 mL/min signify possible Chronic Kidney     Disease.   Anion gap 17 (*) 5 - 15  LIPASE, BLOOD     Status: None   Collection Time    03/05/14  3:59 AM      Result Value Ref Range   Lipase 23  11 - 59 U/L  URINALYSIS, ROUTINE W REFLEX MICROSCOPIC     Status: Abnormal   Collection Time    03/05/14  7:06 AM      Result Value Ref Range   Color, Urine YELLOW  YELLOW   APPearance CLEAR  CLEAR   Specific Gravity, Urine >1.046 (*) 1.005 - 1.030   pH 5.5  5.0 - 8.0   Glucose, UA NEGATIVE  NEGATIVE mg/dL   Hgb urine dipstick NEGATIVE  NEGATIVE   Bilirubin Urine NEGATIVE  NEGATIVE   Ketones, ur NEGATIVE  NEGATIVE mg/dL   Protein, ur NEGATIVE  NEGATIVE mg/dL   Urobilinogen, UA 0.2  0.0 - 1.0 mg/dL   Nitrite NEGATIVE  NEGATIVE   Leukocytes, UA NEGATIVE  NEGATIVE   Comment: MICROSCOPIC NOT DONE ON URINES WITH NEGATIVE PROTEIN, BLOOD, LEUKOCYTES, NITRITE, OR GLUCOSE <1000 mg/dL.   Ct Abdomen Pelvis W Contrast  03/05/2014   CLINICAL DATA:  Right lower quadrant pain.  Initial encounter.  EXAM: CT ABDOMEN AND PELVIS WITH CONTRAST  TECHNIQUE: Multidetector CT imaging of the abdomen and pelvis was performed using the standard protocol following bolus administration of intravenous contrast.  CONTRAST:  119m OMNIPAQUE IOHEXOL 300 MG/ML  SOLN  COMPARISON:  None.  FINDINGS: BODY WALL: Unremarkable.  LOWER CHEST: Bibasilar atelectasis.  ABDOMEN/PELVIS:  Liver: Suspect diffuse, mild fatty infiltration of the liver. No focal lesion.  Biliary: No evidence of biliary obstruction or stone.  Pancreas: Unremarkable.  Spleen: 1 cm cyst or other benign appearing process in the inferior spleen.  Adrenals: Unremarkable.  Kidneys and ureters: No hydronephrosis or stone.  Bladder: Unremarkable.  Reproductive: Unremarkable.  Bowel: Thickened and avidly  enhancing appendix with extensive mesoappendix inflammation and localized peritoneal fluid. The  midportion of the inferior wall is indistinct when viewed coronally. There is no abscess or gross perforation. Mild reactive ileus involving the distal ileum. No small bowel obstruction.  Retroperitoneum: No mass or adenopathy.  Peritoneum: No free ascites or pneumoperitoneum.  Vascular: No acute abnormality.  OSSEOUS: No acute abnormalities.  These results were called by telephone at the time of interpretation on 03/05/2014 at 6:07 am to Dr. Julianne Rice , who verbally acknowledged these results.  IMPRESSION: Suppurative appendicitis. There may be early necrosis of the mid portion, but no gross perforation or abscess.   Electronically Signed   By: Jorje Guild M.D.   On: 03/05/2014 06:07      Assessment/Plan Acute suppurative appendicitis Leukocytosis - 64.1 H/o umbilical hernia repair Depression  Plan: 1.  Admit to [facility], urgent lap appy 2.  NPO, bowel rest, IVF, pain control, antiemetics, antibiotics (Zosyn Day #1) 3.  Discussed risks/benefits of procedure, the patient would like to proceed    Coralie Keens, [facility] 03/05/2014, 8:15 AM Pager: [phone]

## 2014-03-05 NOTE — ED Notes (Signed)
Bedside report given to Natalie, RN.

## 2014-03-05 NOTE — ED Notes (Signed)
Bed: WA17 Expected date: 03/05/14 Expected time: 1:43 AM Means of arrival: Ambulance Comments: abd pain

## 2014-03-05 NOTE — Op Note (Signed)
10:57 AM  PATIENT:  Ernest Riley  53 y.o. male  Patient Care Team: No Pcp Per Patient as PCP - General (General Practice)  PRE-OPERATIVE DIAGNOSIS:  Appendicitis  POST-OPERATIVE DIAGNOSIS:  Perforated Appendicitis with suppuration  PROCEDURE:  Procedure(s): APPENDECTOMY LAPAROSCOPIC  SURGEON:  Surgeon(s): Karie SodaSteven Solara Goodchild, MD  ANESTHESIA:   local and general  EBL:  Total I/O In: 2000 [I.V.:2000] Out: 380 [Urine:350; Blood:30]  Delay start of Pharmacological VTE agent (>24hrs) due to surgical blood loss or risk of bleeding:  no  DRAINS: none   SPECIMEN:  Source of Specimen:   APPENDIX  DISPOSITION OF SPECIMEN:  PATHOLOGY  COUNTS:  YES  PLAN OF CARE: Admit for overnight observation  PATIENT DISPOSITION:  PACU - hemodynamically stable.   INDICATIONS: Patient with concerning symptoms & work up suspicious for appendicitis.  Surgery was recommended:  The anatomy & physiology of the digestive tract was discussed.  The pathophysiology of appendicitis was discussed.  Natural history risks without surgery was discussed.   I feel the risks of no intervention will lead to serious problems that outweigh the operative risks; therefore, I recommended diagnostic laparoscopy with removal of appendix to remove the pathology.  Laparoscopic & open techniques were discussed.   I noted a good likelihood this will help address the problem.    Risks such as bleeding, infection, abscess, leak, reoperation, possible ostomy, hernia, heart attack, death, and other risks were discussed.  Goals of post-operative recovery were discussed as well.  We will work to minimize complications.  Questions were answered.  The patient expresses understanding & wishes to proceed with surgery.  OR FINDINGS: Perforated appendix.  Retrocecal.  Obvious pus in the pelvis and right paracolic gutter.  Mild/moderate peritonitis and right lower quadrant.  Rest of abdomen unaffected.  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  Smooth.  No nodularity or ulceration.  No obstruction.  No perforation.  I left it alone.  DESCRIPTION:   The patient was identified & brought into the operating room. The patient was positioned supine with arms tucked. SCDs were active during the entire case. The patient underwent general anesthesia without any difficulty.  The abdomen was prepped and draped in a sterile fashion. A Surgical Timeout confirmed our plan.  I placed a 5mm port In the left upper quadrant using optical entry technique given history of prior umbilical hernia repair.  We induced carbon dioxide insufflation.  Camera inspection revealed no injury.  I placed additional ports under direct laparoscopic visualization.  I mobilized the terminal ileum to proximal ascending colon in a lateral to medial fashion.  I took care to avoid injuring any retroperitoneal structures.  There is obvious tan-gray purulence in the pelvic cul-de-sac and in the right paracolic gutter.  I mobilized the cecum and terminal ileal mesentery to find a perforated appendix with inflammation.   I freed the appendix off its attachments to the ascending colon and cecal mesentery.  I elevated the appendix. I skeletonized the mesoappendix. I was able to free off the base of the appendix which was still viable.  I stapled the appendix off the cecum using a laparoscopic stapler. I took a healthy cuff of viable cecum. I ligated the mesoappendix and assured hemostasis in the mesentery.  This did require a 2-0 silk suture using laparoscopic intracorporeal suturing given the fact that the ileocecal mesentery was quite friable and inflamed.  I placed the appendix inside an EndoCatch bag and removed out the 12 mm port.  I did copious  irrigation. 6 Liters.  Hemostasis was good in the mesoappendix, colon mesentery, and retroperitoneum. Staple line was intact on the cecum with no bleeding. I washed out the pelvis, retrohepatic space and right paracolic gutter. I washed  out the left side as well.  Hemostasis is good. There was no other perforation or injury.  Did run the small bowel and find a soft smooth Meckel's diverticulum in the mid-ileum.  No perforation.  No inflammation.  No nodularity.  I left it alone.  Because the area cleaned up well after irrigation, I did not place a drain.  I aspirated the carbon dioxide. I removed the ports. I closed the 12 mm fascia site using a 0 Vicryl stitch. I closed skin using 4-0 monocryl stitch.  Patient was extubated and sent to the recovery room.  I discussed the operative findings with the patient's wife. I suspect the patient is going used in the hospital at least overnight and will need antibiotics for 5 days. Questions answered. They expressed understanding and appreciation.  Patient's smiling in recovery room stable.  Ernest Riley, M.D., F.A.C.S. Gastrointestinal and Minimally Invasive Surgery [facility] [address] [place], [place] [address] [phone] Main / Paging

## 2014-03-05 NOTE — ED Provider Notes (Signed)
CSN: [id number]     Arrival date & time 03/05/14  0153 History   First MD Initiated Contact with Patient 03/05/14 0301     Chief Complaint  Patient presents with  . Abdominal Pain     (Consider location/radiation/quality/duration/timing/severity/associated sxs/prior Treatment) HPI Patient presents with right-sided abdominal pain that started at 3 PM yesterday. Is not associated with food. Patient denies any nausea, vomiting or diarrhea. He's had diaphoresis but without definite fever. No previously similar symptoms. Pain has become more constant and the clotting to the right lower quadrant. He denies any urinary symptoms including hematuria, dysuria, frequency or urgency. No previous abdominal surgeries. Past Medical History  Diagnosis Date  . Depression    Past Surgical History  Procedure Laterality Date  . Umbilical hernia repair  07/18/2006    Dr. Derrell LollingIngram  . Laparoscopic appendectomy N/A 03/05/2014    Procedure: APPENDECTOMY LAPAROSCOPIC;  Surgeon: Karie SodaSteven Gross, MD;  Location: [facility];  Service: General;  Laterality: N/A;   History reviewed. No pertinent family history. History  Substance Use Topics  . Smoking status: Never Smoker   . Smokeless tobacco: Never Used  . Alcohol Use: Yes     Comment: rarely    Review of Systems  Constitutional: Positive for diaphoresis. Negative for fever.  Respiratory: Negative for cough.   Cardiovascular: Negative for chest pain.  Gastrointestinal: Positive for abdominal pain. Negative for nausea, vomiting, diarrhea and constipation.  Genitourinary: Negative for dysuria, frequency and hematuria.  Musculoskeletal: Negative for back pain, myalgias, neck pain and neck stiffness.  Skin: Negative for rash and wound.  Neurological: Negative for dizziness, weakness, light-headedness and numbness.  All other systems reviewed and are negative.     Allergies  Sulfa antibiotics  Home Medications   Prior to Admission medications   Medication  Sig Start Date End Date Taking? Authorizing Provider  citalopram (CELEXA) 20 MG tablet Take 1 tablet by mouth daily. 01/02/14  Yes Historical Provider, MD  ibuprofen (ADVIL,MOTRIN) 200 MG tablet Take 800 mg by mouth every 6 (six) hours as needed for moderate pain.   Yes Historical Provider, MD  Ibuprofen-Diphenhydramine HCl (ADVIL PM) 200-25 MG CAPS Take 3 tablets by mouth at bedtime as needed (sleep).   Yes Historical Provider, MD  amoxicillin-clavulanate (AUGMENTIN) 875-125 MG per tablet Take 1 tablet by mouth 2 (two) times daily. 03/05/14   Karie SodaSteven Gross, MD  oxyCODONE (OXY IR/ROXICODONE) 5 MG immediate release tablet Take 1-2 tablets (5-10 mg total) by mouth every 4 (four) hours as needed for moderate pain, severe pain or breakthrough pain. 03/05/14   Karie SodaSteven Gross, MD   BP 154/90  Pulse 78  Temp(Src) 97.8 F (36.6 C) (Oral)  Resp 7  Ht 5\' 10"  (1.778 m)  Wt 249 lb 5.4 oz (113.1 kg)  BMI 35.78 kg/m2  SpO2 92% Physical Exam  Nursing note and vitals reviewed. Constitutional: He is oriented to person, place, and time. He appears well-developed and well-nourished. No distress.  HENT:  Head: Normocephalic and atraumatic.  Mouth/Throat: Oropharynx is clear and moist.  Eyes: EOM are normal. Pupils are equal, round, and reactive to light.  Neck: Normal range of motion. Neck supple.  Cardiovascular: Normal rate and regular rhythm.   Pulmonary/Chest: Effort normal and breath sounds normal. No respiratory distress. He has no wheezes. He has no rales.  Abdominal: Soft. Bowel sounds are normal. He exhibits no distension and no mass. There is tenderness (patient with tenderness in the right upper and right lower quadrants. Guarding in the right  lower quadrant. No definite rebound tenderness.). There is guarding. There is no rebound.  Musculoskeletal: Normal range of motion. He exhibits no edema and no tenderness.  No CVA tenderness bilaterally.  Neurological: He is alert and oriented to person, place,  and time.  Skin: Skin is warm and dry. No rash noted. No erythema.  Psychiatric: He has a normal mood and affect. His behavior is normal.    ED Course  Procedures (including critical care time) Labs Review Labs Reviewed  CBC WITH DIFFERENTIAL - Abnormal; Notable for the following:    WBC 18.6 (*)    Neutrophils Relative % 88 (*)    Neutro Abs 16.4 (*)    Lymphocytes Relative 5 (*)    Monocytes Absolute 1.4 (*)    All other components within normal limits  COMPREHENSIVE METABOLIC PANEL - Abnormal; Notable for the following:    Glucose, Bld 172 (*)    GFR calc non Af Amer 83 (*)    Anion gap 17 (*)    All other components within normal limits  URINALYSIS, ROUTINE W REFLEX MICROSCOPIC - Abnormal; Notable for the following:    Specific Gravity, Urine >1.046 (*)    All other components within normal limits  CBC - Abnormal; Notable for the following:    WBC 11.4 (*)    RBC 3.77 (*)    Hemoglobin 11.9 (*)    HCT 34.9 (*)    All other components within normal limits  COMPREHENSIVE METABOLIC PANEL - Abnormal; Notable for the following:    Glucose, Bld 147 (*)    Albumin 3.4 (*)    All other components within normal limits  LIPASE, BLOOD  TROPONIN I  SURGICAL PATHOLOGY    Imaging Review Dg Abd 1 View  03/07/2014   CLINICAL DATA:  Laparoscopic appendectomy on 03/06/2014. Ileus following gastrointestinal surgery.  EXAM: ABDOMEN - 1 VIEW  COMPARISON:  CT abdomen and pelvis 03/05/2014  FINDINGS: Residual oral contrast material is present in the colon. Gas is present throughout the colon, which is nondilated. There is mild gaseous distention of small bowel loops in the right lower quadrant measuring up to 3 cm in diameter. Evaluation for intraperitoneal free air is limited by supine technique, however there is evidence of free air bilaterally in the mid and lower abdomen related to recent surgery. Degenerative changes are noted in the lower lumbar spine.  IMPRESSION: Mild gaseous  distention of small bowel loops in the lower abdomen, which may reflect mild postoperative ileus.   Electronically Signed   By: Sebastian AcheAllen  Grady   On: 03/07/2014 14:26   Dg Chest Port 1 View  03/07/2014   CLINICAL DATA:  Hypoxia.  Atelectasis.  EXAM: PORTABLE CHEST - 1 VIEW  COMPARISON:  03/06/2014.  FINDINGS: Mediastinum hilar structures normal. Stable cardiomegaly with normal pulmonary vascularity. Interim partial clearing of bibasilar dense atelectasis with prominent residual in the left lung base. Left lower lobe pneumonia cannot be excluded. Small left pleural effusion cannot be excluded. No pneumothorax. No acute bony abnormality .  IMPRESSION: 1. Low lung volumes with basilar atelectasis. There has been partial clearing from prior exam. Left base atelectasis and/or possible pneumonia is particularly prominent. Small left pleural effusion. 2. Stable cardiomegaly, no CHF.   Electronically Signed   By: Maisie Fushomas  Register   On: 03/07/2014 14:17   Dg Chest Port 1 View  03/06/2014   CLINICAL DATA:  Shortness of breath on exertion.  Initial encounter.  EXAM: PORTABLE CHEST - 1 VIEW  COMPARISON:  None.  FINDINGS: Very low lung volumes with bibasilar lung opacities, plate like on the right. Atelectasis was seen on preceding abdominal CT. No evidence for edema, effusion, or pneumothorax. Normal heart size and mediastinal contours when considering technical factors.  IMPRESSION: Low lung volumes with bibasilar atelectasis, increased from CT 03/05/2014.   Electronically Signed   By: Tiburcio Pea M.D.   On: 03/06/2014 06:37     EKG Interpretation None      MDM   Final diagnoses:  Other acute appendicitis   Mild hypoxia in emergency department. Discuss CT abdomen with radiology. Suspect atelectasis in the right lung base. Likely due to pain and decreased inspiration.   Discussed with Dr. [name]. Advised antibiotics in the emergency department. Surgical will see and admit.  Loren Racer,  MD 03/07/14 [phone]

## 2014-03-05 NOTE — Transfer of Care (Signed)
Immediate Anesthesia Transfer of Care Note  Patient: Ernest Riley  Procedure(s) Performed: Procedure(s): APPENDECTOMY LAPAROSCOPIC (N/A)  Patient Location: PACU  Anesthesia Type:General  Level of Consciousness: awake, alert , oriented and patient cooperative  Airway & Oxygen Therapy: Patient Spontanous Breathing and Patient connected to face mask oxygen  Post-op Assessment: Report given to PACU RN, Post -op Vital signs reviewed and stable and Patient moving all extremities  Post vital signs: Reviewed and stable  Complications: No apparent anesthesia complications

## 2014-03-06 ENCOUNTER — Observation Stay (HOSPITAL_COMMUNITY): Payer: BC Managed Care – PPO

## 2014-03-06 DIAGNOSIS — I4891 Unspecified atrial fibrillation: Secondary | ICD-10-CM | POA: Diagnosis not present

## 2014-03-06 DIAGNOSIS — Y839 Surgical procedure, unspecified as the cause of abnormal reaction of the patient, or of later complication, without mention of misadventure at the time of the procedure: Secondary | ICD-10-CM | POA: Diagnosis not present

## 2014-03-06 DIAGNOSIS — F329 Major depressive disorder, single episode, unspecified: Secondary | ICD-10-CM | POA: Diagnosis present

## 2014-03-06 DIAGNOSIS — J9601 Acute respiratory failure with hypoxia: Secondary | ICD-10-CM | POA: Diagnosis not present

## 2014-03-06 DIAGNOSIS — Z881 Allergy status to other antibiotic agents status: Secondary | ICD-10-CM | POA: Diagnosis not present

## 2014-03-06 DIAGNOSIS — E669 Obesity, unspecified: Secondary | ICD-10-CM | POA: Diagnosis present

## 2014-03-06 DIAGNOSIS — I471 Supraventricular tachycardia: Secondary | ICD-10-CM | POA: Diagnosis not present

## 2014-03-06 DIAGNOSIS — E876 Hypokalemia: Secondary | ICD-10-CM | POA: Diagnosis not present

## 2014-03-06 DIAGNOSIS — R1031 Right lower quadrant pain: Secondary | ICD-10-CM | POA: Diagnosis present

## 2014-03-06 DIAGNOSIS — M7989 Other specified soft tissue disorders: Secondary | ICD-10-CM | POA: Diagnosis present

## 2014-03-06 DIAGNOSIS — J9811 Atelectasis: Secondary | ICD-10-CM | POA: Diagnosis not present

## 2014-03-06 DIAGNOSIS — K913 Postprocedural intestinal obstruction: Secondary | ICD-10-CM | POA: Diagnosis not present

## 2014-03-06 DIAGNOSIS — M179 Osteoarthritis of knee, unspecified: Secondary | ICD-10-CM | POA: Diagnosis present

## 2014-03-06 DIAGNOSIS — G4733 Obstructive sleep apnea (adult) (pediatric): Secondary | ICD-10-CM | POA: Diagnosis present

## 2014-03-06 DIAGNOSIS — Z6835 Body mass index (BMI) 35.0-35.9, adult: Secondary | ICD-10-CM | POA: Diagnosis not present

## 2014-03-06 DIAGNOSIS — K3580 Unspecified acute appendicitis: Secondary | ICD-10-CM | POA: Diagnosis present

## 2014-03-06 DIAGNOSIS — Q430 Meckel's diverticulum (displaced) (hypertrophic): Secondary | ICD-10-CM | POA: Diagnosis not present

## 2014-03-06 DIAGNOSIS — K352 Acute appendicitis with generalized peritonitis: Secondary | ICD-10-CM | POA: Diagnosis present

## 2014-03-06 MED ORDER — SACCHAROMYCES BOULARDII 250 MG PO CAPS
250.0000 mg | ORAL_CAPSULE | Freq: Two times a day (BID) | ORAL | Status: DC
Start: 2014-03-06 — End: 2014-03-13
  Administered 2014-03-06 – 2014-03-13 (×15): 250 mg via ORAL
  Filled 2014-03-06 (×19): qty 1

## 2014-03-06 MED ORDER — FUROSEMIDE 10 MG/ML IJ SOLN
40.0000 mg | Freq: Once | INTRAMUSCULAR | Status: AC
  Administered 2014-03-06: 40 mg via INTRAVENOUS
  Filled 2014-03-06: qty 4

## 2014-03-06 MED ORDER — ACETAMINOPHEN 500 MG PO TABS
1000.0000 mg | ORAL_TABLET | Freq: Three times a day (TID) | ORAL | Status: DC
  Administered 2014-03-06 – 2014-03-13 (×20): 1000 mg via ORAL
  Filled 2014-03-06 (×22): qty 2

## 2014-03-06 NOTE — Progress Notes (Signed)
Patient on 50% Venturi mask - desating 85 - 87%,not maintaing 90% , Rapid Response RN Cheryl notified and came to assess,. DR TOTH notified and orders received for Chest xray and transfer to dtepdown unit.Patient transferred to stepdown unit at this time aaccompanied by Rapid Response RN.

## 2014-03-06 NOTE — Progress Notes (Signed)
UR completed 

## 2014-03-06 NOTE — Progress Notes (Signed)
[address] [place], [place] [address] Phone: [phone] FAX: [place] [phone] 07/11/1960  CARE TEAM:  PCP: No PCP Per Patient  Outpatient Care Team: Patient Care Team: No Pcp Per Patient as PCP - General (General Practice)  Inpatient Treatment Team: Treatment Team: Attending Provider: Provider Default, MD; Registered Nurse: Venia Carbon, RN; Registered Nurse: Michelene Gardener, RN   Subjective:  Desaturating on floor - hard to get deep breath Transferred to SDU Sats low 90s % on FM  Objective:  Vital signs:  Filed Vitals:   03/06/14 0200 03/06/14 0400 03/06/14 0455 03/06/14 0503  BP: 130/81     Pulse: 87     Temp: 97.9 F (36.6 C) 97.7 F (36.5 C)    TempSrc: Oral Oral    Resp: 18     Height:  _0  (1.778 m)    Weight:  249 lb 5.4 oz (113.1 kg)    SpO2: 92%  87% 92%       Intake/Output   Yesterday:  10/14 0701 - 10/15 0700 In: 3498.3 [P.O.:240; I.V.:2958.3; IV Piggyback:300] Out: 1630 [Urine:1600; Blood:30] This shift:     Bowel function:  Flatus: y  BM: y  Drain: n/a  Physical Exam:  General: Pt awakens/sleepy at first but then alert & oriented x4 in no acute distress Eyes: PERRL, normal EOM.  Sclera clear.  No icterus Neuro: CN II-XII intact w/o focal sensory/motor deficits. Lymph: No head/neck/groin lymphadenopathy Psych:  No delerium/psychosis/paranoia HENT: Normocephalic, Mucus membranes moist.  No thrush Neck: Supple, No tracheal deviation Chest: No chest wall pain w fair excursion.  Dec breath sounds CV:  Pulses intact.  Regular rhythm MS: Normal AROM mjr joints.  No obvious deformity Abdomen: Soft.  Obese  Mild/moderately distended.  Mildly tender at incisions only.  No evidence of peritonitis.  No incarcerated hernias. Ext:  SCDs BLE.  No mjr edema.  No cyanosis Skin: No petechiae / purpura   Problem List:   Principal Problem:    Acute fulminating appendicitis with perforation and peritonitis s/p lap appendectomy 03/05/2014 Active Problems:   Obesity (BMI 30-39.9)   Meckel diverticulum - asymptomatic   OSA (obstructive sleep apnea)   Assessment  Ernest Riley  53 y.o. male  1 Day Post-Op  Procedure(s): APPENDECTOMY LAPAROSCOPIC  Diagnosis Appendix, Other than Incidental - ACUTE APPENDICITIS WITH SEROSITIS. Vicente Males MD Pathologist, Electronic Signature (Case signed 03/06/2014) Specimen Dinora Hemm and Clinical Information Specimen(s) Obtained: Appendix, Other than Incidental Specimen Clinical Information Appendicitis Ardeen Fillers) Nyaisha Simao Specimen: Appendix Size: 6.8 cm in length x up to 1 cm in diameter. Serosa: Dull tan red and partially covered by a fibrinous exudate. Mucosa: The mucosa is focally hemorrhagic Wall: The wall is intact Lumen: The lumen is patent. Block Summary: One block submitted (GRP:kh 03-05-14) Report signed out from the following location(s) Technical Component and Interpretation performed at [facility], [place], [place] [address]. CLIA #: [id number],  Possible volume overload vs. More likely sleep apnea.  Plan:  -Back off IV fluids.  One dose of Lasix.  Give trial of CPAP.  Keep on clears until pulmonary status improves.  Continue IV antibiotics.  We will plan at least 5 days if not longer depending on his clinical course.  Keep in step down unit for now until he improves.  VTE prophylaxis- SCDs, etc  Mobilize as tolerated to help recovery.  Keep in chair for now until  respiratory status better  Adin Hector, M.D., F.A.C.S. Gastrointestinal and Minimally Invasive Surgery [facility] [address] [place], [place] [address] [phone] Main / Paging   03/06/2014   Results:   Labs: Results for orders placed during the hospital encounter of 03/05/14 (from the past 48 hour(s))  CBC WITH  DIFFERENTIAL     Status: Abnormal   Collection Time    03/05/14  3:59 AM      Result Value Ref Range   WBC 18.6 (*) 4.0 - 10.5 K/uL   RBC 4.78  4.22 - 5.81 MIL/uL   Hemoglobin 15.2  13.0 - 17.0 g/dL   HCT 42.6  39.0 - 52.0 %   MCV 89.1  78.0 - 100.0 fL   MCH 31.8  26.0 - 34.0 pg   MCHC 35.7  30.0 - 36.0 g/dL   RDW 12.2  11.5 - 15.5 %   Platelets 287  150 - 400 K/uL   Neutrophils Relative % 88 (*) 43 - 77 %   Neutro Abs 16.4 (*) 1.7 - 7.7 K/uL   Lymphocytes Relative 5 (*) 12 - 46 %   Lymphs Abs 0.9  0.7 - 4.0 K/uL   Monocytes Relative 7  3 - 12 %   Monocytes Absolute 1.4 (*) 0.1 - 1.0 K/uL   Eosinophils Relative 0  0 - 5 %   Eosinophils Absolute 0.0  0.0 - 0.7 K/uL   Basophils Relative 0  0 - 1 %   Basophils Absolute 0.0  0.0 - 0.1 K/uL  COMPREHENSIVE METABOLIC PANEL     Status: Abnormal   Collection Time    03/05/14  3:59 AM      Result Value Ref Range   Sodium 141  137 - 147 mEq/L   Potassium 4.3  3.7 - 5.3 mEq/L   Chloride 101  96 - 112 mEq/L   CO2 23  19 - 32 mEq/L   Glucose, Bld 172 (*) 70 - 99 mg/dL   BUN 13  6 - 23 mg/dL   Creatinine, Ser 1.02  0.50 - 1.35 mg/dL   Calcium 9.3  8.4 - 10.5 mg/dL   Total Protein 8.2  6.0 - 8.3 g/dL   Albumin 4.2  3.5 - 5.2 g/dL   AST 19  0 - 37 U/L   ALT 30  0 - 53 U/L   Alkaline Phosphatase 98  39 - 117 U/L   Total Bilirubin 0.9  0.3 - 1.2 mg/dL   GFR calc non Af Amer 83 (*) >90 mL/min   GFR calc Af Amer >90  >90 mL/min   Comment: (NOTE)     The eGFR has been calculated using the CKD EPI equation.     This calculation has not been validated in all clinical situations.     eGFR's persistently <90 mL/min signify possible Chronic Kidney     Disease.   Anion gap 17 (*) 5 - 15  LIPASE, BLOOD     Status: None   Collection Time    03/05/14  3:59 AM      Result Value Ref Range   Lipase 23  11 - 59 U/L  URINALYSIS, ROUTINE W REFLEX MICROSCOPIC     Status: Abnormal   Collection Time    03/05/14  7:06 AM      Result Value Ref Range    Color, Urine YELLOW  YELLOW   APPearance CLEAR  CLEAR   Specific Gravity, Urine >1.046 (*)  1.005 - 1.030   pH 5.5  5.0 - 8.0   Glucose, UA NEGATIVE  NEGATIVE mg/dL   Hgb urine dipstick NEGATIVE  NEGATIVE   Bilirubin Urine NEGATIVE  NEGATIVE   Ketones, ur NEGATIVE  NEGATIVE mg/dL   Protein, ur NEGATIVE  NEGATIVE mg/dL   Urobilinogen, UA 0.2  0.0 - 1.0 mg/dL   Nitrite NEGATIVE  NEGATIVE   Leukocytes, UA NEGATIVE  NEGATIVE   Comment: MICROSCOPIC NOT DONE ON URINES WITH NEGATIVE PROTEIN, BLOOD, LEUKOCYTES, NITRITE, OR GLUCOSE <1000 mg/dL.    Imaging / Studies: Ct Abdomen Pelvis W Contrast  03/05/2014   CLINICAL DATA:  Right lower quadrant pain.  Initial encounter.  EXAM: CT ABDOMEN AND PELVIS WITH CONTRAST  TECHNIQUE: Multidetector CT imaging of the abdomen and pelvis was performed using the standard protocol following bolus administration of intravenous contrast.  CONTRAST:  124m OMNIPAQUE IOHEXOL 300 MG/ML  SOLN  COMPARISON:  None.  FINDINGS: BODY WALL: Unremarkable.  LOWER CHEST: Bibasilar atelectasis.  ABDOMEN/PELVIS:  Liver: Suspect diffuse, mild fatty infiltration of the liver. No focal lesion.  Biliary: No evidence of biliary obstruction or stone.  Pancreas: Unremarkable.  Spleen: 1 cm cyst or other benign appearing process in the inferior spleen.  Adrenals: Unremarkable.  Kidneys and ureters: No hydronephrosis or stone.  Bladder: Unremarkable.  Reproductive: Unremarkable.  Bowel: Thickened and avidly enhancing appendix with extensive mesoappendix inflammation and localized peritoneal fluid. The midportion of the inferior wall is indistinct when viewed coronally. There is no abscess or Lalana Wachter perforation. Mild reactive ileus involving the distal ileum. No small bowel obstruction.  Retroperitoneum: No mass or adenopathy.  Peritoneum: No free ascites or pneumoperitoneum.  Vascular: No acute abnormality.  OSSEOUS: No acute abnormalities.  These results were called by telephone at the time of  interpretation on 03/05/2014 at 6:07 am to Dr. DJulianne Rice, who verbally acknowledged these results.  IMPRESSION: Suppurative appendicitis. There may be early necrosis of the mid portion, but no Brinnley Lacap perforation or abscess.   Electronically Signed   By: JJorje GuildM.D.   On: 03/05/2014 06:07   Dg Chest Port 1 View  03/06/2014   CLINICAL DATA:  Shortness of breath on exertion.  Initial encounter.  EXAM: PORTABLE CHEST - 1 VIEW  COMPARISON:  None.  FINDINGS: Very low lung volumes with bibasilar lung opacities, plate like on the right. Atelectasis was seen on preceding abdominal CT. No evidence for edema, effusion, or pneumothorax. Normal heart size and mediastinal contours when considering technical factors.  IMPRESSION: Low lung volumes with bibasilar atelectasis, increased from CT 03/05/2014.   Electronically Signed   By: JJorje GuildM.D.   On: 03/06/2014 06:37    Medications / Allergies: per chart  Antibiotics: Anti-infectives   Start     Dose/Rate Route Frequency Ordered Stop   03/05/14 1700  metroNIDAZOLE (FLAGYL) IVPB 500 mg     500 mg 100 mL/hr over 60 Minutes Intravenous Every 6 hours 03/05/14 1533     03/05/14 1600  piperacillin-tazobactam (ZOSYN) IVPB 3.375 g     3.375 g 12.5 mL/hr over 240 Minutes Intravenous  Once 03/05/14 1533 03/05/14 2311   03/05/14 1600  cefTRIAXone (ROCEPHIN) 2 g in dextrose 5 % 50 mL IVPB    Comments:  Pharmacy may adjust dosing strength / duration / interval for maximal efficacy   2 g 100 mL/hr over 30 Minutes Intravenous Every 24 hours 03/05/14 1533     03/05/14 0815  piperacillin-tazobactam (ZOSYN) IVPB 3.375 g  Status:  Discontinued     3.375 g 12.5 mL/hr over 240 Minutes Intravenous 4 times per day 03/05/14 0803 03/05/14 0803   03/05/14 0630  [MAR Hold]  piperacillin-tazobactam (ZOSYN) IVPB 3.375 g     (On MAR Hold since 03/05/14 0836)   3.375 g 12.5 mL/hr over 240 Minutes Intravenous  Once 03/05/14 0620 03/05/14 1054   03/05/14 0000   amoxicillin-clavulanate (AUGMENTIN) 875-125 MG per tablet     1 tablet Oral 2 times daily 03/05/14 1122         Note: Portions of this report may have been transcribed using voice recognition software. Every effort was made to ensure accuracy; however, inadvertent computerized transcription errors may be present.   Any transcriptional errors that result from this process are unintentional.

## 2014-03-06 NOTE — Progress Notes (Signed)
RT placed PT on Auto-titration CPAP (Min 7cm - Max 20cm) with 8 lpm 02 bleed in. Current Sp02 94, HR 73, RR 17. PT states he is receiving sufficient air on inhalation and is able to exhale completely. RN aware.

## 2014-03-06 NOTE — Plan of Care (Signed)
Problem: Phase I Progression Outcomes Goal: Voiding-avoid urinary catheter unless indicated Outcome: Not Progressing Pt with urinary retention.

## 2014-03-07 ENCOUNTER — Inpatient Hospital Stay (HOSPITAL_COMMUNITY): Payer: BC Managed Care – PPO

## 2014-03-07 ENCOUNTER — Encounter (HOSPITAL_COMMUNITY): Payer: Self-pay | Admitting: Surgery

## 2014-03-07 DIAGNOSIS — J9601 Acute respiratory failure with hypoxia: Secondary | ICD-10-CM

## 2014-03-07 DIAGNOSIS — J9811 Atelectasis: Secondary | ICD-10-CM

## 2014-03-07 LAB — CBC
HCT: 34.9 % — ABNORMAL LOW (ref 39.0–52.0)
HEMOGLOBIN: 11.9 g/dL — AB (ref 13.0–17.0)
MCH: 31.6 pg (ref 26.0–34.0)
MCHC: 34.1 g/dL (ref 30.0–36.0)
MCV: 92.6 fL (ref 78.0–100.0)
Platelets: 217 10*3/uL (ref 150–400)
RBC: 3.77 MIL/uL — AB (ref 4.22–5.81)
RDW: 12.4 % (ref 11.5–15.5)
WBC: 11.4 10*3/uL — ABNORMAL HIGH (ref 4.0–10.5)

## 2014-03-07 LAB — COMPREHENSIVE METABOLIC PANEL
ALK PHOS: 71 U/L (ref 39–117)
ALT: 19 U/L (ref 0–53)
AST: 15 U/L (ref 0–37)
Albumin: 3.4 g/dL — ABNORMAL LOW (ref 3.5–5.2)
Anion gap: 12 (ref 5–15)
BILIRUBIN TOTAL: 0.5 mg/dL (ref 0.3–1.2)
BUN: 14 mg/dL (ref 6–23)
CHLORIDE: 97 meq/L (ref 96–112)
CO2: 30 meq/L (ref 19–32)
Calcium: 9.1 mg/dL (ref 8.4–10.5)
Creatinine, Ser: 0.83 mg/dL (ref 0.50–1.35)
GLUCOSE: 147 mg/dL — AB (ref 70–99)
POTASSIUM: 4.2 meq/L (ref 3.7–5.3)
SODIUM: 139 meq/L (ref 137–147)
Total Protein: 7.3 g/dL (ref 6.0–8.3)

## 2014-03-07 LAB — TROPONIN I: Troponin I: <0.3 ng/mL (ref <0.30)

## 2014-03-07 MED ORDER — ENOXAPARIN SODIUM 40 MG/0.4ML ~~LOC~~ SOLN
40.0000 mg | SUBCUTANEOUS | Status: DC
  Administered 2014-03-07 – 2014-03-13 (×7): 40 mg via SUBCUTANEOUS
  Filled 2014-03-07 (×7): qty 0.4

## 2014-03-07 MED ORDER — FUROSEMIDE 10 MG/ML IJ SOLN
20.0000 mg | Freq: Once | INTRAMUSCULAR | Status: AC
  Administered 2014-03-07: 20 mg via INTRAVENOUS
  Filled 2014-03-07: qty 2

## 2014-03-07 NOTE — Progress Notes (Signed)
Pt taken off CPAP and placed on 55% Venturi mask. Pt tolerating well. RT will continue to monitor.

## 2014-03-07 NOTE — Clinical Documentation Improvement (Signed)
Possible Clinical Conditions?   Acute Respiratory Failure Acute on Chronic Respiratory Failure Acute Respiratory Distress Acute Respiratory Insufficiency Acute Respiratory Insufficiency following surgery or trauma Other Condition Cannot Clinically Determine    Risk Factors: Post-op respiratory distress noted per 10/16 progress notes.  Persistent hypoxia, O2 sats in 80s off facemask per 10/16 progress notes.   Treatment: Trial of CPAP at hs (03/06/14) per 03/07/14 progress notes.  Thank You, Marciano SequinWanda Mathews-Bethea,RN,BSN, Clinical Documentation Specialist:  [phone]  [facility]- Health Information Management

## 2014-03-07 NOTE — Progress Notes (Addendum)
Agree with pulmonary evaluation.  Try to keep on the dry side.  Continue antibiotics.  Wait for ileus to resolve.  Continue NPO x sips  I strongly suspect he does have sleep apnea.  Would treat him as such.  Will see if pulmonary willing to help work this up most likely as an outpatient.  Ardeth SportsmanSteven C. Maryah Marinaro, M.D., F.A.C.S. Gastrointestinal and Minimally Invasive Surgery [facility] [address] [place], [place] [id number] [phone] Main / Paging

## 2014-03-07 NOTE — Consult Note (Signed)
Name: Ernest Riley MRN: [id number] DOB: Sep 10, 1960    ADMISSION DATE:  03/05/2014 CONSULTATION DATE:  10/16  REFERRING MD :   Zola Button PA-C  CHIEF COMPLAINT:   Persistent hypoxia   BRIEF PATIENT DESCRIPTION:  53 year old male s/p laparoscopic appendectomy for  Ruptured appendix on 10/14. PCCM asked to see on 10/16 for persistent high FIO2 requirements.    SIGNIFICANT EVENTS  10/14: admitted w/ RLQ pain and + appendicitis  10/15: CXR w/ low volume.  O2 sats in 80s off facemask. Trial of CPAP at HS-->tolerated well.  10/16: PCCM asked to see for hypoxia    STUDIES:  Ct abd 10/14: appendicitis. There may be early necrosis of the mid portion, but no gross perforation or abscess.    HISTORY OF PRESENT ILLNESS:   53 y/o white male with h/o Depression and h/o umbilical hernia repair by Dr. [name] on 07/18/2006 who presented to [facility] on 10/14 with right sided abdominal pain which radiates to the RLQ. The pain started at 3pm on 03/04/14 and has progressively worsened as the night went on. Around 1am he was brought to the ER due to 10/10 severe pain. Dx eval was consistent w/ appendicitis. He underwent laparoscopic appendectomy w/ post-op diagnosis of perforated appendix, appendicitis and peritonitis on 10/14. Post-op course has been notable for persistent hypoxia requiring supplemental oxygen via facemask in order to keep sats > 90%, this was worse at HS in the supine position so surgery team tried CPAP during the night of 10/15 w/ good symptomatic effect. PCCM has been asked to see on 10/16 as the pt continues to require oxygen via facemask in order to prevent desaturation.   PAST MEDICAL HISTORY :   has a past medical history of Depression.  has past surgical history that includes Umbilical hernia repair (07/18/2006). Prior to Admission medications   Medication Sig Start Date End Date Taking? Authorizing Provider  citalopram (CELEXA) 20 MG tablet Take 1 tablet by mouth daily.  01/02/14  Yes Historical Provider, MD  ibuprofen (ADVIL,MOTRIN) 200 MG tablet Take 800 mg by mouth every 6 (six) hours as needed for moderate pain.   Yes Historical Provider, MD  Ibuprofen-Diphenhydramine HCl (ADVIL PM) 200-25 MG CAPS Take 3 tablets by mouth at bedtime as needed (sleep).   Yes Historical Provider, MD  amoxicillin-clavulanate (AUGMENTIN) 875-125 MG per tablet Take 1 tablet by mouth 2 (two) times daily. 03/05/14   Karie Soda, MD  oxyCODONE (OXY IR/ROXICODONE) 5 MG immediate release tablet Take 1-2 tablets (5-10 mg total) by mouth every 4 (four) hours as needed for moderate pain, severe pain or breakthrough pain. 03/05/14   Karie Soda, MD   Allergies  Allergen Reactions  . Sulfa Antibiotics Swelling    FAMILY HISTORY:  family history is not on file. SOCIAL HISTORY:  reports that he has never smoked. He has never used smokeless tobacco. He reports that he drinks alcohol. He reports that he does not use illicit drugs.  REVIEW OF SYSTEMS:   Constitutional: Negative for fever, chills, weight loss, malaise/fatigue and diaphoresis.  HENT: Negative for hearing loss, ear pain, nosebleeds, congestion, sore throat, neck pain, tinnitus and ear discharge.   Eyes: Negative for blurred vision, double vision, photophobia, pain, discharge and redness.  Respiratory: Negative for cough, hemoptysis, sputum production, shortness of breath w/ deep breath, wheezing and stridor.  Has snoring at baseline day time sleepiness.  Cardiovascular: Negative for chest pain, palpitations, orthopnea, claudication, leg swelling and PND.  Gastrointestinal: Negative for  heartburn, nausea, vomiting, abdominal pain w deep breath, diarrhea, constipation, blood in stool and melena.  Genitourinary: Negative for dysuria, urgency, frequency, hematuria and flank pain.  Musculoskeletal: Negative for myalgias, back pain, joint pain and falls.  Skin: Negative for itching and rash.  Neurological: Negative for dizziness,  tingling, tremors, sensory change, speech change, focal weakness, seizures, loss of consciousness, weakness and headaches.  Endo/Heme/Allergies: Negative for environmental allergies and polydipsia. Does not bruise/bleed easily.  SUBJECTIVE:  No distress  VITAL SIGNS: Temp:  [98 F (36.7 C)-99 F (37.2 C)] 98 F (36.7 C) (10/16 1212) Pulse Rate:  [70-89] 82 (10/16 1212) Resp:  [8-23] 18 (10/16 1212) BP: (121-166)/(70-112) 153/88 mmHg (10/16 1212) SpO2:  [90 %-96 %] 92 % (10/16 1212) FiO2 (%):  [55 %] 55 % (10/16 0801)  PHYSICAL EXAMINATION: General:  53 year old male, currently in no acute distress. Sitting up in chair w/ FM in place. Does desaturate to 80s when removed.  Neuro:  Awake, appropriate, no focal def, no motor def  HEENT:  [place], no JVD, mmm  Cardiovascular:  Rrr, brisk CR, skin warm  Lungs:  Posterior rales, no accessory muscle use.  Abdomen:  Distended. + BS, dressings intact  Musculoskeletal:  Intact  Skin:  Intact    Recent Labs Lab 03/05/14 0359  NA 141  K 4.3  CL 101  CO2 23  BUN 13  CREATININE 1.02  GLUCOSE 172*    Recent Labs Lab 03/05/14 0359  HGB 15.2  HCT 42.6  WBC 18.6*  PLT 287   Dg Chest Port 1 View  03/06/2014   CLINICAL DATA:  Shortness of breath on exertion.  Initial encounter.  EXAM: PORTABLE CHEST - 1 VIEW  COMPARISON:  None.  FINDINGS: Very low lung volumes with bibasilar lung opacities, plate like on the right. Atelectasis was seen on preceding abdominal CT. No evidence for edema, effusion, or pneumothorax. Normal heart size and mediastinal contours when considering technical factors.  IMPRESSION: Low lung volumes with bibasilar atelectasis, increased from CT 03/05/2014.   Electronically Signed   By: Tiburcio PeaJonathan  Watts M.D.   On: 03/06/2014 06:37    ASSESSMENT / PLAN:  Hypoxia due to atelectasis in setting of decreased abd compliance (mix of obesity and abd pain/distention after laparoscopic surgery).  Probable OSA -h/o loud snoring &  occ witnessed apneas  Discussion.  Has lots of abd pain and says hard to take a deep breath. CXR w/ low volume, but no clear evidence of edema. Clinically no evidence of PNA. Think hypoxia mostly due to low lung volumes and atx. Probably does have element of sleep apnea. He feels that the CPAP was helpful to get him good rest last night.   Rec: Push pulmonary hygiene measures: IS and flutter Ambulate Wean FIO2 as able HS auto-set CPAP. Doubt he can go home with this but we should get him set up for PSG and sleep medicine f/u after d/c Primary service has ordered cardiac enzymes and given lasix. This is reasonable but doubt he is overloaded given his CXR.   Care during the described time interval was provided by me and/or other providers on the critical care team.  I have reviewed this patient's available data, including medical history, events of note, physical examination and test results as part of my evaluation Elicited key history of post op hypoxia, lt basal crackles on exam, reviewed CXR -does not seem to have intrinsic lung disease.  Cyril Mourningakesh Zareah Hunzeker MD. Tonny BollmanFCCP. [facility]  Pager [phone] If no response call [phone]    03/07/2014, 12:58 PM

## 2014-03-07 NOTE — Progress Notes (Signed)
2 Days Post-Op  Subjective: Still on a FM with sats in the 80's-90's range.  He does not have a history of sleep apnea, but talking to his wife he has had issues with this for some time.  He has never smoked, but he does have a history of depression and he used ETOH for his depression and would have episodes of binge drinking.  He has stopped this and is currently on SSRI.  No prior cardiac history.  No history of tobacco use or drug use.  Objective: Vital signs in last 24 hours: Temp:  [98 F (36.7 C)-99 F (37.2 C)] 98.3 F (36.8 C) (10/16 0800) Pulse Rate:  [70-89] 89 (10/16 1000) Resp:  [8-23] 15 (10/16 1000) BP: (121-166)/(70-112) 142/82 mmHg (10/16 1000) SpO2:  [90 %-96 %] 93 % (10/16 1000) FiO2 (%):  [55 %] 55 % (10/16 0801) Last BM Date: 03/04/14 600 Po yesterday/emeisis x 1 reported yesterday Afebrile, VSS, No labs Film yesterday shows low lung volumes,with bibasilare atelectasis Intake/Output from previous day: 10/15 0701 - 10/16 0700 In: 1845 [P.O.:600; I.V.:745; IV Piggyback:500] Out: 2575 [Urine:2575] Intake/Output this shift: Total I/O In: 230 [P.O.:120; I.V.:10; IV Piggyback:100] Out: 0   General appearance: alert, cooperative and on FM with sats dropping intot he 80's talking. Resp: bilateral basilar rales. Cardio: regular and tachycardic.  No mumur's noted. GI: mildly distended, port sites look fine. some flatus, BS hypoactive Extremities: trace edema  Lab Results:   Recent Labs  03/05/14 0359  WBC 18.6*  HGB 15.2  HCT 42.6  PLT 287    BMET  Recent Labs  03/05/14 0359  NA 141  K 4.3  CL 101  CO2 23  GLUCOSE 172*  BUN 13  CREATININE 1.02  CALCIUM 9.3   PT/INR No results found for this basename: LABPROT, INR,  in the last 72 hours   Recent Labs Lab 03/05/14 0359  AST 19  ALT 30  ALKPHOS 98  BILITOT 0.9  PROT 8.2  ALBUMIN 4.2     Lipase     Component Value Date/Time   LIPASE 23 03/05/2014 0359     Studies/Results: Dg  Chest Port 1 View  03/06/2014   CLINICAL DATA:  Shortness of breath on exertion.  Initial encounter.  EXAM: PORTABLE CHEST - 1 VIEW  COMPARISON:  None.  FINDINGS: Very low lung volumes with bibasilar lung opacities, plate like on the right. Atelectasis was seen on preceding abdominal CT. No evidence for edema, effusion, or pneumothorax. Normal heart size and mediastinal contours when considering technical factors.  IMPRESSION: Low lung volumes with bibasilar atelectasis, increased from CT 03/05/2014.   Electronically Signed   By: Tiburcio PeaJonathan  Watts M.D.   On: 03/06/2014 06:37    Medications: . acetaminophen  1,000 mg Oral TID  . cefTRIAXone (ROCEPHIN)  IV  2 g Intravenous Q24H  . citalopram  20 mg Oral Daily  . lip balm  1 application Topical BID  . metronidazole  500 mg Intravenous Q6H  . saccharomyces boulardii  250 mg Oral BID   . 0.9 % NaCl with KCl 20 mEq / L 10 mL/hr at 03/07/14 0800   Prior to Admission medications   Medication Sig Start Date End Date Taking? Authorizing Provider  citalopram (CELEXA) 20 MG tablet Take 1 tablet by mouth daily. 01/02/14  Yes Historical Provider, MD  ibuprofen (ADVIL,MOTRIN) 200 MG tablet Take 800 mg by mouth every 6 (six) hours as needed for moderate pain.   Yes Historical Provider, MD  Ibuprofen-Diphenhydramine HCl (ADVIL PM) 200-25 MG CAPS Take 3 tablets by mouth at bedtime as needed (sleep).   Yes Historical Provider, MD  amoxicillin-clavulanate (AUGMENTIN) 875-125 MG per tablet Take 1 tablet by mouth 2 (two) times daily. 03/05/14   Karie SodaSteven Gross, MD  oxyCODONE (OXY IR/ROXICODONE) 5 MG immediate release tablet Take 1-2 tablets (5-10 mg total) by mouth every 4 (four) hours as needed for moderate pain, severe pain or breakthrough pain. 03/05/14   Karie SodaSteven Gross, MD     Assessment/Plan Perforated Appendicitis with suppuration APPENDECTOMY LAPAROSCOPIC, 03/05/2014, Karie SodaSteven Gross, MD Post op respiratory distress/volume over load vs OSA Obesity Body mass index  is 35.78 kg/(m^2). OSA Hx of depression Hx of ETOH use    Plan:  i am going to repeat cxr and abd films now.  Recheck labs, give him some lasix and I have ask Pulmonary to see also.  He has sleep apnea according to the history his wife gives, but I don't think that can completely explain the ongoing symptoms we are seeing  No chest pain, no history of chest pain or cardiac history.  He has never smoked but did use ETOH to treat his depression at one time.   Lovenox for DVT prophylaxis.  I do not think he is ready to transfer to the floor.       LOS: 2 days    Jaselynn Tamas 03/07/2014

## 2014-03-08 DIAGNOSIS — J9601 Acute respiratory failure with hypoxia: Secondary | ICD-10-CM

## 2014-03-08 MED ORDER — CHLORHEXIDINE GLUCONATE 0.12 % MT SOLN
15.0000 mL | Freq: Two times a day (BID) | OROMUCOSAL | Status: DC
  Administered 2014-03-09 – 2014-03-13 (×9): 15 mL via OROMUCOSAL
  Filled 2014-03-08 (×12): qty 15

## 2014-03-08 MED ORDER — CETYLPYRIDINIUM CHLORIDE 0.05 % MT LIQD
7.0000 mL | Freq: Two times a day (BID) | OROMUCOSAL | Status: DC
  Administered 2014-03-09 – 2014-03-13 (×9): 7 mL via OROMUCOSAL

## 2014-03-08 NOTE — Consult Note (Addendum)
Name: Ernest Riley MRN: [id number] DOB: 02-14-1961    ADMISSION DATE:  03/05/2014 CONSULTATION DATE:  10/16  REFERRING MD :   Zola ButtonWill Jennings PA-C  CHIEF COMPLAINT:   Persistent hypoxia   BRIEF PATIENT DESCRIPTION:  53 y/o white male with h/o Depression and h/o umbilical hernia repair by Dr. Derrell LollingIngram on 07/18/2006 [facility] with right sided abdominal pain which radiates to the RLQ. The pain started at 3pm on 03/04/14 and has progressively worsened as the night went on. Around 1am he was brought to the ER due to 10/10 severe pain. Dx eval was consistent w/ appendicitis. He underwent laparoscopic appendectomy w/ post-op diagnosis of perforated appendix, appendicitis and peritonitis on 10/14. Post-op course has been notable for persistent hypoxia requiring supplemental oxygen via facemask in order to keep sats > 90%, this was worse at HS in the supine position so surgery team tried CPAP during the night of 10/15 w/ good symptomatic effect. PCCM has been asked to see on 10/16 as the pt continues to require oxygen via facemask in order to prevent desaturation.     SIGNIFICANT EVENTS  10/14: admitted w/ RLQ pain and + appendicitis  10/15: CXR w/ low volume.  O2 sats in 80s off facemask. Trial of CPAP at HS-->tolerated well.  10/16: PCCM asked to see for hypoxia    SUBJECTIVE:  03/08/14: feels hypoxemia better - "pulse ox improved from 93% to 98%" but still on face mask. Still not doing IS regularly. Did get CPAP last night    VITAL SIGNS: Temp:  [97.8 F (36.6 C)-99.2 F (37.3 C)] 99.2 F (37.3 C) (10/17 0800) Pulse Rate:  [65-85] 68 (10/17 0600) Resp:  [7-21] 13 (10/17 0600) BP: (97-167)/(28-98) 167/93 mmHg (10/17 0600) SpO2:  [91 %-100 %] 100 % (10/17 0600) FiO2 (%):  [55 %] 55 % (10/16 2020) Weight:  [111.1 kg (244 lb 14.9 oz)] 111.1 kg (244 lb 14.9 oz) (10/17 0400)  PHYSICAL EXAMINATION: General:  53 year old male, currently in no acute distress. Sitting  up in  Bed Neuro:  Awake, appropriate, no focal def, no motor def  HEENT:  [place], no JVD, mmm  Cardiovascular:  Rrr, brisk CR, skin warm  Lungs:  Posterior rales, no accessory muscle use.  FAce mask 02, Pulse ox 98% Abdomen:  Distended. + BS, dressings intact  Musculoskeletal:  Intact  Skin:  Intact     PULMONARY No results found for this basename: PHART, PCO2, PCO2ART, PO2, PO2ART, HCO3, TCO2, O2SAT,  in the last 168 hours  CBC  Recent Labs Lab 03/05/14 0359 03/07/14 1429  HGB 15.2 11.9*  HCT 42.6 34.9*  WBC 18.6* 11.4*  PLT 287 217    COAGULATION No results found for this basename: INR,  in the last 168 hours  CARDIAC   Recent Labs Lab 03/07/14 1429  TROPONINI <0.30   No results found for this basename: PROBNP,  in the last 168 hours   CHEMISTRY  Recent Labs Lab 03/05/14 0359 03/07/14 1429  NA 141 139  K 4.3 4.2  CL 101 97  CO2 23 30  GLUCOSE 172* 147*  BUN 13 14  CREATININE 1.02 0.83  CALCIUM 9.3 9.1   Estimated Creatinine Clearance: 129.9 ml/min (by C-G formula based on Cr of 0.83).   LIVER  Recent Labs Lab 03/05/14 0359 03/07/14 1429  AST 19 15  ALT 30 19  ALKPHOS 98 71  BILITOT 0.9 0.5  PROT 8.2 7.3  ALBUMIN 4.2  3.4*     INFECTIOUS No results found for this basename: LATICACIDVEN, PROCALCITON,  in the last 168 hours   ENDOCRINE CBG (last 3)  No results found for this basename: GLUCAP,  in the last 72 hours       IMAGING x48h Dg Abd 1 View  03/07/2014   CLINICAL DATA:  Laparoscopic appendectomy on 03/06/2014. Ileus following gastrointestinal surgery.  EXAM: ABDOMEN - 1 VIEW  COMPARISON:  CT abdomen and pelvis 03/05/2014  FINDINGS: Residual oral contrast material is present in the colon. Gas is present throughout the colon, which is nondilated. There is mild gaseous distention of small bowel loops in the right lower quadrant measuring up to 3 cm in diameter. Evaluation for intraperitoneal free air is limited by supine technique,  however there is evidence of free air bilaterally in the mid and lower abdomen related to recent surgery. Degenerative changes are noted in the lower lumbar spine.  IMPRESSION: Mild gaseous distention of small bowel loops in the lower abdomen, which may reflect mild postoperative ileus.   Electronically Signed   By: Sebastian AcheAllen  Grady   On: 03/07/2014 14:26   Dg Chest Port 1 View  03/07/2014   CLINICAL DATA:  Hypoxia.  Atelectasis.  EXAM: PORTABLE CHEST - 1 VIEW  COMPARISON:  03/06/2014.  FINDINGS: Mediastinum hilar structures normal. Stable cardiomegaly with normal pulmonary vascularity. Interim partial clearing of bibasilar dense atelectasis with prominent residual in the left lung base. Left lower lobe pneumonia cannot be excluded. Small left pleural effusion cannot be excluded. No pneumothorax. No acute bony abnormality .  IMPRESSION: 1. Low lung volumes with basilar atelectasis. There has been partial clearing from prior exam. Left base atelectasis and/or possible pneumonia is particularly prominent. Small left pleural effusion. 2. Stable cardiomegaly, no CHF.   Electronically Signed   By: Maisie Fushomas  Register   On: 03/07/2014 14:17   Patient Active Problem List   Diagnosis Date Noted  . Acute respiratory failure with hypoxia 03/08/2014  . OSA (obstructive sleep apnea) 03/06/2014  . Suppurative appendicitis 03/06/2014  . Acute fulminating appendicitis with perforation and peritonitis s/p lap appendectomy 03/05/2014 03/05/2014  . Obesity (BMI 30-39.9) 03/05/2014  . Meckel diverticulum - asymptomatic 03/05/2014     ASSESSMENT / PLAN:  Acute Resp failure Hypoxia due to atelectasis in setting of decreased abd compliance (mix of obesity and abd pain/distention after laparoscopic surgery).  Probable OSA -h/o loud snoring & occ witnessed apneas Doubt PNA   - subjectively better.  Still not doing IS q1h   Rec: Push pulmonary hygiene measures: IS and flutter; again encourage Ambulate Wean FIO2 as  able QHS auto-set CPAP  - . Doubt he can go home with this but we should get him set up for PSG and sleep medicine f/u after d/c Check BNP and if high, give lasix  Try changing face mask to [place]agonah   Dr. Kalman ShanMurali Maurice Fotheringham, M.D., [facility] Center[facility].C.C.P Pulmonary and Critical Care Medicine Staff Physician [facility] [place] Pager: [phone], If no answer or between  15:00h - 7:00h: call [phone]  03/08/2014 11:21 AM

## 2014-03-08 NOTE — Progress Notes (Signed)
Pt stated that he didn't want to use the CPAP tonight.  Pt to notify RT if he changes his mind.  RT to monitor and assess as needed.

## 2014-03-08 NOTE — Progress Notes (Signed)
3 Days Post-Op  Subjective: Still very sore in abdominal area.  Hurts to take a deep breath.  Objective: Vital signs in last 24 hours: Temp:  [97.8 F (36.6 C)-99.2 F (37.3 C)] 99.2 F (37.3 C) (10/17 0800) Pulse Rate:  [65-89] 68 (10/17 0600) Resp:  [7-21] 13 (10/17 0600) BP: (97-167)/(28-98) 167/93 mmHg (10/17 0600) SpO2:  [91 %-100 %] 100 % (10/17 0600) FiO2 (%):  [55 %] 55 % (10/16 2020) Weight:  [244 lb 14.9 oz (111.1 kg)] 244 lb 14.9 oz (111.1 kg) (10/17 0400) Last BM Date: 03/04/14  Intake/Output from previous day: 10/16 0701 - 10/17 0700 In: 1040 [P.O.:360; I.V.:230; IV Piggyback:450] Out: 800 [Urine:800] Intake/Output this shift: Total I/O In: -  Out: 500 [Urine:500]  PE: General- In NAD Abdomen-soft, slightly distended, few bowel sounds, incisions are clean and intact  Lab Results:   Recent Labs  03/07/14 1429  WBC 11.4*  HGB 11.9*  HCT 34.9*  PLT 217   BMET  Recent Labs  03/07/14 1429  NA 139  K 4.2  CL 97  CO2 30  GLUCOSE 147*  BUN 14  CREATININE 0.83  CALCIUM 9.1   PT/INR No results found for this basename: LABPROT, INR,  in the last 72 hours Comprehensive Metabolic Panel:    Component Value Date/Time   NA 139 03/07/2014 1429   NA 141 03/05/2014 0359   K 4.2 03/07/2014 1429   K 4.3 03/05/2014 0359   CL 97 03/07/2014 1429   CL 101 03/05/2014 0359   CO2 30 03/07/2014 1429   CO2 23 03/05/2014 0359   BUN 14 03/07/2014 1429   BUN 13 03/05/2014 0359   CREATININE 0.83 03/07/2014 1429   CREATININE 1.02 03/05/2014 0359   GLUCOSE 147* 03/07/2014 1429   GLUCOSE 172* 03/05/2014 0359   CALCIUM 9.1 03/07/2014 1429   CALCIUM 9.3 03/05/2014 0359   AST 15 03/07/2014 1429   AST 19 03/05/2014 0359   ALT 19 03/07/2014 1429   ALT 30 03/05/2014 0359   ALKPHOS 71 03/07/2014 1429   ALKPHOS 98 03/05/2014 0359   BILITOT 0.5 03/07/2014 1429   BILITOT 0.9 03/05/2014 0359   PROT 7.3 03/07/2014 1429   PROT 8.2 03/05/2014 0359   ALBUMIN 3.4*  03/07/2014 1429   ALBUMIN 4.2 03/05/2014 0359     Studies/Results: Dg Abd 1 View  03/07/2014   CLINICAL DATA:  Laparoscopic appendectomy on 03/06/2014. Ileus following gastrointestinal surgery.  EXAM: ABDOMEN - 1 VIEW  COMPARISON:  CT abdomen and pelvis 03/05/2014  FINDINGS: Residual oral contrast material is present in the colon. Gas is present throughout the colon, which is nondilated. There is mild gaseous distention of small bowel loops in the right lower quadrant measuring up to 3 cm in diameter. Evaluation for intraperitoneal free air is limited by supine technique, however there is evidence of free air bilaterally in the mid and lower abdomen related to recent surgery. Degenerative changes are noted in the lower lumbar spine.  IMPRESSION: Mild gaseous distention of small bowel loops in the lower abdomen, which may reflect mild postoperative ileus.   Electronically Signed   By: Sebastian AcheAllen  Grady   On: 03/07/2014 14:26   Dg Chest Port 1 View  03/07/2014   CLINICAL DATA:  Hypoxia.  Atelectasis.  EXAM: PORTABLE CHEST - 1 VIEW  COMPARISON:  03/06/2014.  FINDINGS: Mediastinum hilar structures normal. Stable cardiomegaly with normal pulmonary vascularity. Interim partial clearing of bibasilar dense atelectasis with prominent residual in the left lung base. Left  lower lobe pneumonia cannot be excluded. Small left pleural effusion cannot be excluded. No pneumothorax. No acute bony abnormality .  IMPRESSION: 1. Low lung volumes with basilar atelectasis. There has been partial clearing from prior exam. Left base atelectasis and/or possible pneumonia is particularly prominent. Small left pleural effusion. 2. Stable cardiomegaly, no CHF.   Electronically Signed   By: Maisie Fushomas  Register   On: 03/07/2014 14:17    Anti-infectives: Anti-infectives   Start     Dose/Rate Route Frequency Ordered Stop   03/05/14 1700  metroNIDAZOLE (FLAGYL) IVPB 500 mg     500 mg 100 mL/hr over 60 Minutes Intravenous Every 6 hours  03/05/14 1533     03/05/14 1600  piperacillin-tazobactam (ZOSYN) IVPB 3.375 g     3.375 g 12.5 mL/hr over 240 Minutes Intravenous  Once 03/05/14 1533 03/05/14 2311   03/05/14 1600  cefTRIAXone (ROCEPHIN) 2 g in dextrose 5 % 50 mL IVPB    Comments:  Pharmacy may adjust dosing strength / duration / interval for maximal efficacy   2 g 100 mL/hr over 30 Minutes Intravenous Every 24 hours 03/05/14 1533     03/05/14 0815  piperacillin-tazobactam (ZOSYN) IVPB 3.375 g  Status:  Discontinued     3.375 g 12.5 mL/hr over 240 Minutes Intravenous 4 times per day 03/05/14 0803 03/05/14 0803   03/05/14 0630  [MAR Hold]  piperacillin-tazobactam (ZOSYN) IVPB 3.375 g     (On MAR Hold since 03/05/14 0836)   3.375 g 12.5 mL/hr over 240 Minutes Intravenous  Once 03/05/14 0620 03/05/14 1054   03/05/14 0000  amoxicillin-clavulanate (AUGMENTIN) 875-125 MG per tablet     1 tablet Oral 2 times daily 03/05/14 1122        Assessment Principal Problem:   Acute fulminating appendicitis with perforation and peritonitis s/p lap appendectomy 03/05/2014-bowel function starting to return.    Acute respiratory insufficiency postop-still on face mask O2 and CPAP       LOS: 3 days   Plan: Advance to full liquids.  Continue IV abxs (Rocephin and Flagyl).  Keep in SDU until pulmonary status improves.   Ernest Riley 03/08/2014

## 2014-03-09 DIAGNOSIS — I4891 Unspecified atrial fibrillation: Secondary | ICD-10-CM

## 2014-03-09 DIAGNOSIS — K3589 Other acute appendicitis: Secondary | ICD-10-CM

## 2014-03-09 DIAGNOSIS — I517 Cardiomegaly: Secondary | ICD-10-CM

## 2014-03-09 LAB — BASIC METABOLIC PANEL
Anion gap: 15 (ref 5–15)
BUN: 11 mg/dL (ref 6–23)
CO2: 28 mEq/L (ref 19–32)
Calcium: 9.1 mg/dL (ref 8.4–10.5)
Chloride: 92 mEq/L — ABNORMAL LOW (ref 96–112)
Creatinine, Ser: 0.76 mg/dL (ref 0.50–1.35)
GFR calc Af Amer: >90 mL/min (ref >90)
GFR calc non Af Amer: >90 mL/min (ref >90)
Glucose, Bld: 100 mg/dL — ABNORMAL HIGH (ref 70–99)
Potassium: 3.2 mEq/L — ABNORMAL LOW (ref 3.7–5.3)
Sodium: 135 mEq/L — ABNORMAL LOW (ref 137–147)

## 2014-03-09 LAB — CBC
HCT: 38.1 % — ABNORMAL LOW (ref 39.0–52.0)
Hemoglobin: 13.1 g/dL (ref 13.0–17.0)
MCH: 31.2 pg (ref 26.0–34.0)
MCHC: 34.4 g/dL (ref 30.0–36.0)
MCV: 90.7 fL (ref 78.0–100.0)
PLATELETS: 246 10*3/uL (ref 150–400)
RBC: 4.2 MIL/uL — ABNORMAL LOW (ref 4.22–5.81)
RDW: 12.3 % (ref 11.5–15.5)
WBC: 7 10*3/uL (ref 4.0–10.5)

## 2014-03-09 LAB — TROPONIN I
Troponin I: <0.3 ng/mL (ref <0.30)
Troponin I: <0.3 ng/mL (ref <0.30)
Troponin I: <0.3 ng/mL (ref <0.30)

## 2014-03-09 LAB — MAGNESIUM
MAGNESIUM: 2 mg/dL (ref 1.5–2.5)
Magnesium: 2 mg/dL (ref 1.5–2.5)

## 2014-03-09 LAB — PRO B NATRIURETIC PEPTIDE: Pro B Natriuretic peptide (BNP): 168.2 pg/mL — ABNORMAL HIGH (ref 0–125)

## 2014-03-09 LAB — PHOSPHORUS: Phosphorus: 3.1 mg/dL (ref 2.3–4.6)

## 2014-03-09 MED ORDER — DILTIAZEM LOAD VIA INFUSION
10.0000 mg | Freq: Once | INTRAVENOUS | Status: DC
  Filled 2014-03-09: qty 10

## 2014-03-09 MED ORDER — POTASSIUM CHLORIDE 10 MEQ/100ML IV SOLN
INTRAVENOUS | Status: AC
  Administered 2014-03-09: 10 meq
  Filled 2014-03-09: qty 200

## 2014-03-09 MED ORDER — FUROSEMIDE 10 MG/ML IJ SOLN
20.0000 mg | Freq: Once | INTRAMUSCULAR | Status: AC
  Administered 2014-03-09: 20 mg via INTRAVENOUS
  Filled 2014-03-09: qty 2

## 2014-03-09 MED ORDER — POTASSIUM CHLORIDE 10 MEQ/50ML IV SOLN
10.0000 meq | INTRAVENOUS | Status: AC
  Administered 2014-03-09: 10 meq via INTRAVENOUS

## 2014-03-09 MED ORDER — POTASSIUM CHLORIDE 10 MEQ/100ML IV SOLN
10.0000 meq | INTRAVENOUS | Status: AC
  Administered 2014-03-09 (×4): 10 meq via INTRAVENOUS
  Filled 2014-03-09 (×4): qty 100

## 2014-03-09 MED ORDER — DILTIAZEM HCL 100 MG IV SOLR
5.0000 mg/h | INTRAVENOUS | Status: DC
  Filled 2014-03-09: qty 100

## 2014-03-09 NOTE — Progress Notes (Signed)
Pt refused CPAP for tonight. Pt states he does not feel comfortable wearing CPAP and does not wear CPAP at home. Pt was encouraged to contact RT if he changes his mind. RN aware.

## 2014-03-09 NOTE — Progress Notes (Signed)
  Echocardiogram 2D Echocardiogram has been performed.  Ernest Riley, Ernest Riley 03/09/2014, 3:37 PM

## 2014-03-09 NOTE — Progress Notes (Signed)
4 Days Post-Op  Subjective: Abdominal soreness is better.  Having nausea and vomiting now and just went into SVT or afib with rate about 140-160.  Denies dyspnea or chest pain.  Objective: Vital signs in last 24 hours: Temp:  [97.7 F (36.5 C)-98.8 F (37.1 C)] 97.7 F (36.5 C) (10/18 0821) Pulse Rate:  [62-87] 66 (10/18 0600) Resp:  [9-25] 13 (10/18 0600) BP: (141-164)/(79-100) 154/100 mmHg (10/18 0600) SpO2:  [90 %-97 %] 91 % (10/18 0600) Weight:  [244 lb 11.4 oz (111 kg)] 244 lb 11.4 oz (111 kg) (10/18 0400) Last BM Date: 03/04/14  Intake/Output from previous day: 10/17 0701 - 10/18 0700 In: 1000 [P.O.:360; I.V.:240; IV Piggyback:400] Out: 1950 [Urine:1950] Intake/Output this shift:    PE: General- Vomiting. CV-rapid rate Lungs-decreased breath sounds in bases. Abdomen-soft, slightly distended, hypoactive bowel sounds, incisions are clean and intact  Lab Results:   Recent Labs  03/07/14 1429  WBC 11.4*  HGB 11.9*  HCT 34.9*  PLT 217   BMET  Recent Labs  03/07/14 1429  NA 139  K 4.2  CL 97  CO2 30  GLUCOSE 147*  BUN 14  CREATININE 0.83  CALCIUM 9.1   PT/INR No results found for this basename: LABPROT, INR,  in the last 72 hours Comprehensive Metabolic Panel:    Component Value Date/Time   NA 139 03/07/2014 1429   NA 141 03/05/2014 0359   K 4.2 03/07/2014 1429   K 4.3 03/05/2014 0359   CL 97 03/07/2014 1429   CL 101 03/05/2014 0359   CO2 30 03/07/2014 1429   CO2 23 03/05/2014 0359   BUN 14 03/07/2014 1429   BUN 13 03/05/2014 0359   CREATININE 0.83 03/07/2014 1429   CREATININE 1.02 03/05/2014 0359   GLUCOSE 147* 03/07/2014 1429   GLUCOSE 172* 03/05/2014 0359   CALCIUM 9.1 03/07/2014 1429   CALCIUM 9.3 03/05/2014 0359   AST 15 03/07/2014 1429   AST 19 03/05/2014 0359   ALT 19 03/07/2014 1429   ALT 30 03/05/2014 0359   ALKPHOS 71 03/07/2014 1429   ALKPHOS 98 03/05/2014 0359   BILITOT 0.5 03/07/2014 1429   BILITOT 0.9 03/05/2014 0359   PROT 7.3 03/07/2014 1429   PROT 8.2 03/05/2014 0359   ALBUMIN 3.4* 03/07/2014 1429   ALBUMIN 4.2 03/05/2014 0359     Studies/Results: Dg Abd 1 View  03/07/2014   CLINICAL DATA:  Laparoscopic appendectomy on 03/06/2014. Ileus following gastrointestinal surgery.  EXAM: ABDOMEN - 1 VIEW  COMPARISON:  CT abdomen and pelvis 03/05/2014  FINDINGS: Residual oral contrast material is present in the colon. Gas is present throughout the colon, which is nondilated. There is mild gaseous distention of small bowel loops in the right lower quadrant measuring up to 3 cm in diameter. Evaluation for intraperitoneal free air is limited by supine technique, however there is evidence of free air bilaterally in the mid and lower abdomen related to recent surgery. Degenerative changes are noted in the lower lumbar spine.  IMPRESSION: Mild gaseous distention of small bowel loops in the lower abdomen, which may reflect mild postoperative ileus.   Electronically Signed   By: Sebastian AcheAllen  Grady   On: 03/07/2014 14:26   Dg Chest Port 1 View  03/07/2014   CLINICAL DATA:  Hypoxia.  Atelectasis.  EXAM: PORTABLE CHEST - 1 VIEW  COMPARISON:  03/06/2014.  FINDINGS: Mediastinum hilar structures normal. Stable cardiomegaly with normal pulmonary vascularity. Interim partial clearing of bibasilar dense atelectasis with prominent residual in the  left lung base. Left lower lobe pneumonia cannot be excluded. Small left pleural effusion cannot be excluded. No pneumothorax. No acute bony abnormality .  IMPRESSION: 1. Low lung volumes with basilar atelectasis. There has been partial clearing from prior exam. Left base atelectasis and/or possible pneumonia is particularly prominent. Small left pleural effusion. 2. Stable cardiomegaly, no CHF.   Electronically Signed   By: Maisie Fushomas  Register   On: 03/07/2014 14:17    Anti-infectives: Anti-infectives   Start     Dose/Rate Route Frequency Ordered Stop   03/05/14 1700  metroNIDAZOLE (FLAGYL) IVPB 500  mg     500 mg 100 mL/hr over 60 Minutes Intravenous Every 6 hours 03/05/14 1533     03/05/14 1600  piperacillin-tazobactam (ZOSYN) IVPB 3.375 g     3.375 g 12.5 mL/hr over 240 Minutes Intravenous  Once 03/05/14 1533 03/05/14 2311   03/05/14 1600  cefTRIAXone (ROCEPHIN) 2 g in dextrose 5 % 50 mL IVPB    Comments:  Pharmacy may adjust dosing strength / duration / interval for maximal efficacy   2 g 100 mL/hr over 30 Minutes Intravenous Every 24 hours 03/05/14 1533     03/05/14 0815  piperacillin-tazobactam (ZOSYN) IVPB 3.375 g  Status:  Discontinued     3.375 g 12.5 mL/hr over 240 Minutes Intravenous 4 times per day 03/05/14 0803 03/05/14 0803   03/05/14 0630  [MAR Hold]  piperacillin-tazobactam (ZOSYN) IVPB 3.375 g     (On MAR Hold since 03/05/14 0836)   3.375 g 12.5 mL/hr over 240 Minutes Intravenous  Once 03/05/14 0620 03/05/14 1054   03/05/14 0000  amoxicillin-clavulanate (AUGMENTIN) 875-125 MG per tablet     1 tablet Oral 2 times daily 03/05/14 1122        Assessment Principal Problem:   Acute fulminating appendicitis with perforation and peritonitis s/p lap appendectomy 03/05/2014-has a postop ileus    Acute respiratory insufficiency postop-borderline hypoxia and nasal cannula oxygen.     Acute tachyarrhythmia- afib vs SVT       LOS: 4 days   Plan:  Decrease diet to sips and ice chips.  Check EKG and labs.  Start Cardizem drip.  Discussed with CCM.  Addendum:  He spontaneously converted to NSR.  Will hold Cardizem drip.  Marylou Wages J 03/09/2014

## 2014-03-09 NOTE — Progress Notes (Signed)
Name: Ernest Riley MRN: [id number] DOB: March 10, 1961    ADMISSION DATE:  03/05/2014 CONSULTATION DATE:  10/16  REFERRING MD :   Zola Button PA-C  CHIEF COMPLAINT:   Persistent hypoxia   BRIEF PATIENT DESCRIPTION:  53 y/o white male with h/o Depression and h/o umbilical hernia repair by Dr. [name] on 07/18/2006 who presented to [facility] on 10/14 with right sided abdominal pain which radiates to the RLQ. The pain started at 3pm on 03/04/14 and has progressively worsened as the night went on. Around 1am he was brought to the ER due to 10/10 severe pain. Dx eval was consistent w/ appendicitis. He underwent laparoscopic appendectomy w/ post-op diagnosis of perforated appendix, appendicitis and peritonitis on 10/14. Post-op course has been notable for persistent hypoxia requiring supplemental oxygen via facemask in order to keep sats > 90%, this was worse at HS in the supine position so surgery team tried CPAP during the night of 10/15 w/ good symptomatic effect. PCCM has been asked to see on 10/16 as the pt continues to require oxygen via facemask in order to prevent desaturation.     SIGNIFICANT EVENTS  10/14: admitted w/ RLQ pain and + appendicitis  10/15: CXR w/ low volume.  O2 sats in 80s off facemask. Trial of CPAP at HS-->tolerated well.  10/16: PCCM asked to see for hypoxia  03/08/14: feels hypoxemia better - "pulse ox improved from 93% to 98%" but still on face mask. Still not doing IS regularly. Did get CPAP last night    SUBJECTIVE/OVERNIGHT/INTERVAL HX 02/27/14 - did not use cpap last night - refused. Hypoxemia better - down to 3L [place].  Increased Vt on Incentive spirometry. Transient a fib resolved and in sinusnow   VITAL SIGNS: Temp:  [97.7 F (36.5 C)-98.7 F (37.1 C)] 98.4 F (36.9 C) (10/18 1211) Pulse Rate:  [62-80] 79 (10/18 1100) Resp:  [9-25] 19 (10/18 1100) BP: (141-172)/(79-100) 172/90 mmHg (10/18 1100) SpO2:  [90 %-97 %] 94 % (10/18 1100) Weight:  [111 kg (244  lb 11.4 oz)] 111 kg (244 lb 11.4 oz) (10/18 0400)  PHYSICAL EXAMINATION: General:  53 year old male, currently in no acute distress. Sitting up in  Bed Neuro:  Awake, appropriate, no focal def, no motor def  HEENT:  [place], no JVD, mmm  Cardiovascular:  Rrr, brisk CR, skin warm  Lungs:  Posterior rales, no accessory muscle use.  [place] 02, Pulse ox 98% Abdomen:  Distended. + BS, dressings intact  Musculoskeletal:  Intact  Skin:  Intact     PULMONARY No results found for this basename: PHART, PCO2, PCO2ART, PO2, PO2ART, HCO3, TCO2, O2SAT,  in the last 168 hours  CBC  Recent Labs Lab 03/05/14 0359 03/07/14 1429 03/09/14 1008  HGB 15.2 11.9* 13.1  HCT 42.6 34.9* 38.1*  WBC 18.6* 11.4* 7.0  PLT 287 217 246    COAGULATION No results found for this basename: INR,  in the last 168 hours  CARDIAC    Recent Labs Lab 03/07/14 1429 03/09/14 1008  TROPONINI <0.30 <0.30    Recent Labs Lab 03/09/14 0551  PROBNP 168.2*     CHEMISTRY  Recent Labs Lab 03/05/14 0359 03/07/14 1429 03/09/14 0551 03/09/14 1008  NA 141 139  --  135*  K 4.3 4.2  --  3.2*  CL 101 97  --  92*  CO2 23 30  --  28  GLUCOSE 172* 147*  --  100*  BUN 13 14  --  11  CREATININE 1.02 0.83  --  0.76  CALCIUM 9.3 9.1  --  9.1  MG  --   --  2.0 2.0  PHOS  --   --  3.1  --    Estimated Creatinine Clearance: 134.8 ml/min (by C-G formula based on Cr of 0.76).   LIVER  Recent Labs Lab 03/05/14 0359 03/07/14 1429  AST 19 15  ALT 30 19  ALKPHOS 98 71  BILITOT 0.9 0.5  PROT 8.2 7.3  ALBUMIN 4.2 3.4*     INFECTIOUS No results found for this basename: LATICACIDVEN, PROCALCITON,  in the last 168 hours   ENDOCRINE CBG (last 3)  No results found for this basename: GLUCAP,  in the last 72 hours       IMAGING x48h Dg Abd 1 View  03/07/2014   CLINICAL DATA:  Laparoscopic appendectomy on 03/06/2014. Ileus following gastrointestinal surgery.  EXAM: ABDOMEN - 1 VIEW  COMPARISON:  CT abdomen  and pelvis 03/05/2014  FINDINGS: Residual oral contrast material is present in the colon. Gas is present throughout the colon, which is nondilated. There is mild gaseous distention of small bowel loops in the right lower quadrant measuring up to 3 cm in diameter. Evaluation for intraperitoneal free air is limited by supine technique, however there is evidence of free air bilaterally in the mid and lower abdomen related to recent surgery. Degenerative changes are noted in the lower lumbar spine.  IMPRESSION: Mild gaseous distention of small bowel loops in the lower abdomen, which may reflect mild postoperative ileus.   Electronically Signed   By: Sebastian AcheAllen  Grady   On: 03/07/2014 14:26   Dg Chest Port 1 View  03/07/2014   CLINICAL DATA:  Hypoxia.  Atelectasis.  EXAM: PORTABLE CHEST - 1 VIEW  COMPARISON:  03/06/2014.  FINDINGS: Mediastinum hilar structures normal. Stable cardiomegaly with normal pulmonary vascularity. Interim partial clearing of bibasilar dense atelectasis with prominent residual in the left lung base. Left lower lobe pneumonia cannot be excluded. Small left pleural effusion cannot be excluded. No pneumothorax. No acute bony abnormality .  IMPRESSION: 1. Low lung volumes with basilar atelectasis. There has been partial clearing from prior exam. Left base atelectasis and/or possible pneumonia is particularly prominent. Small left pleural effusion. 2. Stable cardiomegaly, no CHF.   Electronically Signed   By: Maisie Fushomas  Register   On: 03/07/2014 14:17   Patient Active Problem List   Diagnosis Date Noted  . Acute respiratory failure with hypoxia 03/08/2014  . OSA (obstructive sleep apnea) 03/06/2014  . Suppurative appendicitis 03/06/2014  . Acute fulminating appendicitis with perforation and peritonitis s/p lap appendectomy 03/05/2014 03/05/2014  . Obesity (BMI 30-39.9) 03/05/2014  . Meckel diverticulum - asymptomatic 03/05/2014     ASSESSMENT / PLAN:  Acute Resp failure Hypoxia due to  atelectasis in setting of decreased abd compliance (mix of obesity and abd pain/distention after laparoscopic surgery).  Probable OSA -h/o loud snoring & occ witnessed apneas Doubt PNA   - subjectively better.  Doing IS. Objectively better -down to 3L [place]  Rec: Push pulmonary hygiene measures: IS and flutter; again encourage Ambulate Wean FIO2 as able QHS auto-set CPAP  - . Doubt he can go home with this but we should get him set up for PSG and sleep medicine f/u after d/c Lasix 20mg  IV x1  With 20meq kcl   A FIb  - resolved in a few minutes  - trop x 1 negative  PLAN  - lasix x 1 - get echo -  dc benadryk orders - ensure K  > 4   D/w Dr Abbey Chattersosenbower of CC  Dr. Kalman ShanMurali Jobin Montelongo, M.D., [facility].C.C.P Pulmonary and Critical Care Medicine Staff Physician [place] [facility] [place] Pulmonary and Critical Care Pager: [phone], If no answer or between  15:00h - 7:00h: call [phone]  03/09/2014 1:07 PM

## 2014-03-10 ENCOUNTER — Encounter (HOSPITAL_COMMUNITY): Payer: Self-pay | Admitting: Radiology

## 2014-03-10 ENCOUNTER — Inpatient Hospital Stay (HOSPITAL_COMMUNITY): Payer: BC Managed Care – PPO

## 2014-03-10 DIAGNOSIS — R0600 Dyspnea, unspecified: Secondary | ICD-10-CM

## 2014-03-10 LAB — BASIC METABOLIC PANEL
Anion gap: 17 — ABNORMAL HIGH (ref 5–15)
BUN: 11 mg/dL (ref 6–23)
CALCIUM: 9 mg/dL (ref 8.4–10.5)
CO2: 27 mEq/L (ref 19–32)
CREATININE: 0.73 mg/dL (ref 0.50–1.35)
Chloride: 93 mEq/L — ABNORMAL LOW (ref 96–112)
GFR calc Af Amer: >90 mL/min (ref >90)
GFR calc non Af Amer: >90 mL/min (ref >90)
GLUCOSE: 91 mg/dL (ref 70–99)
Potassium: 3.4 mEq/L — ABNORMAL LOW (ref 3.7–5.3)
Sodium: 137 mEq/L (ref 137–147)

## 2014-03-10 LAB — MAGNESIUM: Magnesium: 2 mg/dL (ref 1.5–2.5)

## 2014-03-10 LAB — GLUCOSE, CAPILLARY: Glucose-Capillary: 88 mg/dL (ref 70–99)

## 2014-03-10 LAB — PHOSPHORUS: Phosphorus: 2.9 mg/dL (ref 2.3–4.6)

## 2014-03-10 MED ORDER — POTASSIUM CHLORIDE CRYS ER 20 MEQ PO TBCR
20.0000 meq | EXTENDED_RELEASE_TABLET | Freq: Once | ORAL | Status: AC
  Administered 2014-03-10: 20 meq via ORAL
  Filled 2014-03-10: qty 1

## 2014-03-10 MED ORDER — FUROSEMIDE 10 MG/ML IJ SOLN
20.0000 mg | Freq: Two times a day (BID) | INTRAMUSCULAR | Status: DC
  Administered 2014-03-10: 20 mg via INTRAVENOUS
  Filled 2014-03-10: qty 2

## 2014-03-10 MED ORDER — IOHEXOL 350 MG/ML SOLN
100.0000 mL | Freq: Once | INTRAVENOUS | Status: AC | PRN
  Administered 2014-03-10: 100 mL via INTRAVENOUS

## 2014-03-10 MED ORDER — POTASSIUM CHLORIDE 10 MEQ/100ML IV SOLN
10.0000 meq | INTRAVENOUS | Status: AC
  Administered 2014-03-10 (×4): 10 meq via INTRAVENOUS
  Filled 2014-03-10 (×4): qty 100

## 2014-03-10 MED ORDER — FUROSEMIDE 10 MG/ML IJ SOLN
20.0000 mg | Freq: Once | INTRAMUSCULAR | Status: AC
  Administered 2014-03-10: 20 mg via INTRAVENOUS
  Filled 2014-03-10: qty 2

## 2014-03-10 MED ORDER — HYDROMORPHONE HCL 1 MG/ML IJ SOLN
0.5000 mg | INTRAMUSCULAR | Status: DC | PRN
  Administered 2014-03-10 – 2014-03-11 (×3): 1 mg via INTRAVENOUS
  Filled 2014-03-10 (×3): qty 1

## 2014-03-10 NOTE — Progress Notes (Signed)
VASCULAR LAB PRELIMINARY  PRELIMINARY  PRELIMINARY  PRELIMINARY  Bilateral lower extremity venous duplex completed.    Preliminary report:  Bilateral:  No evidence of DVT, superficial thrombosis, or Baker's Cyst.   Kamani Lewter, RVS 03/10/2014, 9:00 AM

## 2014-03-10 NOTE — Care Management Note (Signed)
    Page 1 of 2   03/10/2014     5:22:31 PM CARE MANAGEMENT NOTE 03/10/2014  Patient:  Ernest Riley,Ernest Riley   Account Number:  [id number]  Date Initiated:  03/10/2014  Documentation initiated by:  DAVIS,RHONDA  Subjective/Objective Assessment:   surg patient with resp depression requiring c-pap and fi02 of 50%     Action/Plan:   home when stable   Anticipated DC Date:  03/13/2014   Anticipated DC Plan:  HOME W HOME HEALTH SERVICES  In-house referral  NA      DC Planning Services  NA      [facility]   Choice offered to / List presented to:  NA   DME arranged  NA      DME agency  NA     HH arranged  NA      HH agency  NA   Status of service:  In process, will continue to follow Medicare Important Message given?   (If response is "NO", the following Medicare IM given date fields will be blank) Date Medicare IM given:   Medicare IM given by:   Date Additional Medicare IM given:   Additional Medicare IM given by:    Discharge Disposition:    Per UR Regulation:  Reviewed for med. necessity/level of care/duration of stay  If discussed at Long Length of Stay Meetings, dates discussed:    Comments:  [id number]/Rhonda Earlene Plateravis, RN, BSN, CCM Chart reviewed. Discharge needs and patient's stay to be reviewed and followed by case manager.

## 2014-03-10 NOTE — Progress Notes (Signed)
Patient ID: Ernest Riley, male   DOB: 25-Nov-1960, 53 y.o.   MRN: [id number] 5 Days Post-Op  Subjective: More shortness of breath last night but was not wearing CPAP as ordered. Better today. Mild abdominal soreness. Has been nauseated and not taking anything by mouth but denies nausea today. Had bowel movement and flatus last night.  Objective: Vital signs in last 24 hours: Temp:  [98 F (36.7 C)-98.6 F (37 C)] 98.1 F (36.7 C) (10/19 0800) Pulse Rate:  [60-77] 67 (10/19 1115) Resp:  [12-28] 28 (10/19 1115) BP: (145-193)/(80-110) 156/100 mmHg (10/19 1115) SpO2:  [85 %-96 %] 87 % (10/19 1115) FiO2 (%):  [50 %] 50 % (10/19 1115) Weight:  [260 lb 2.3 oz (118 kg)] 260 lb 2.3 oz (118 kg) (10/19 0400) Last BM Date: 03/09/14  Intake/Output from previous day: 10/18 0701 - 10/19 0700 In: 820 [P.O.:240; I.V.:180; IV Piggyback:400] Out: 3150 [Urine:3150] Intake/Output this shift: Total I/O In: 50 [I.V.:50] Out: 1350 [Urine:1350]  General appearance: alert, cooperative and no distress Resp: clear to auscultation bilaterally and without increased work of breathing GI: normal findings: soft, non-tender Incision/Wound: clean and dry  Lab Results:   Recent Labs  03/07/14 1429 03/09/14 1008  WBC 11.4* 7.0  HGB 11.9* 13.1  HCT 34.9* 38.1*  PLT 217 246   BMET  Recent Labs  03/09/14 1008 03/10/14 0005  NA 135* 137  K 3.2* 3.4*  CL 92* 93*  CO2 28 27  GLUCOSE 100* 91  BUN 11 11  CREATININE 0.76 0.73  CALCIUM 9.1 9.0     Studies/Results: Ct Angio Chest Pe W/cm &/or Wo Cm  03/10/2014   CLINICAL DATA:  Acute respiratory failure, oxygen desaturation, acute shortness of breath  EXAM: CT ANGIOGRAPHY CHEST WITH CONTRAST  TECHNIQUE: Multidetector CT imaging of the chest was performed using the standard protocol during bolus administration of intravenous contrast. Multiplanar CT image reconstructions and MIPs were obtained to evaluate the vascular anatomy.  CONTRAST:  100mL  OMNIPAQUE IOHEXOL 350 MG/ML SOLN  COMPARISON:  None.  FINDINGS: Aorta normal caliber without aneurysm or dissection.  Heart appears mildly enlarged.  Atherosclerotic calcifications at coronary arteries.  Assessment of lower lobes degraded by motion artifacts and atelectasis.  Pulmonary arteries appear grossly patent.  No evidence of pulmonary embolism.  Scattered bibasilar atelectasis greater in RIGHT middle and RIGHT lower lobes.  Minimal increased attenuation centrally within the upper lobes could reflect minimal infection or edema.  No pleural effusion or pneumothorax.  No acute osseous findings.  Review of the MIP images confirms the above findings.  IMPRESSION: No evidence of pulmonary embolism.  Bibasilar atelectasis greater on RIGHT.  Question infiltrate centrally in upper lobes bilaterally, could reflect minimal edema or infection.   Electronically Signed   By: Ulyses SouthwardMark  Boles M.D.   On: 03/10/2014 14:19    Anti-infectives: Anti-infectives   Start     Dose/Rate Route Frequency Ordered Stop   03/05/14 1700  metroNIDAZOLE (FLAGYL) IVPB 500 mg     500 mg 100 mL/hr over 60 Minutes Intravenous Every 6 hours 03/05/14 1533     03/05/14 1600  piperacillin-tazobactam (ZOSYN) IVPB 3.375 g     3.375 g 12.5 mL/hr over 240 Minutes Intravenous  Once 03/05/14 1533 03/05/14 2311   03/05/14 1600  cefTRIAXone (ROCEPHIN) 2 g in dextrose 5 % 50 mL IVPB    Comments:  Pharmacy may adjust dosing strength / duration / interval for maximal efficacy   2 g 100 mL/hr over 30  Minutes Intravenous Every 24 hours 03/05/14 1533     03/05/14 0815  piperacillin-tazobactam (ZOSYN) IVPB 3.375 g  Status:  Discontinued     3.375 g 12.5 mL/hr over 240 Minutes Intravenous 4 times per day 03/05/14 0803 03/05/14 0803   03/05/14 0630  [MAR Hold]  piperacillin-tazobactam (ZOSYN) IVPB 3.375 g     (On MAR Hold since 03/05/14 0836)   3.375 g 12.5 mL/hr over 240 Minutes Intravenous  Once 03/05/14 0620 03/05/14 1054   03/05/14 0000   amoxicillin-clavulanate (AUGMENTIN) 875-125 MG per tablet     1 tablet Oral 2 times daily 03/05/14 1122        Assessment/Plan: s/p Procedure(s): APPENDECTOMY LAPAROSCOPIC, Perforated appendicitis  Continue IV antibiotics for now. Try clear liquid diet. Postop hypoxia. Likely hypoventilation secondary to pain and obesity. CCM following.    LOS: 5 days    Demica Zook T 03/10/2014

## 2014-03-10 NOTE — Progress Notes (Signed)
Name: Ernest Riley MRN: [id number] DOB: Nov 29, 1960    ADMISSION DATE:  03/05/2014 CONSULTATION DATE:  10/16  REFERRING MD :   Zola ButtonWill Jennings PA-C  CHIEF COMPLAINT:   Persistent hypoxia   BRIEF PATIENT DESCRIPTION:  53 y/o white male with h/o Depression and h/o umbilical hernia repair by Dr. Derrell LollingIngram on 07/18/2006 [facility] with right sided abdominal pain which radiates to the RLQ. The pain started at 3pm on 03/04/14 and has progressively worsened as the night went on. Around 1am he was brought to the ER due to 10/10 severe pain. Dx eval was consistent w/ appendicitis. He underwent laparoscopic appendectomy w/ post-op diagnosis of perforated appendix, appendicitis and peritonitis on 10/14. Post-op course has been notable for persistent hypoxia requiring supplemental oxygen via facemask in order to keep sats > 90%, this was worse at HS in the supine position so surgery team tried CPAP during the night of 10/15 w/ good symptomatic effect. PCCM has been asked to see on 10/16 as the pt continues to require oxygen via facemask in order to prevent desaturation.     SIGNIFICANT EVENTS  10/14: admitted w/ RLQ pain and + appendicitis  10/15: CXR w/ low volume.  O2 sats in 80s off facemask. Trial of CPAP at HS-->tolerated well.  10/16: PCCM asked to see for hypoxia  10/19 refuses bipap during the night 10/18 2 d with EF 65% mild LVH    SUBJECTIVE/OVERNIGHT/INTERVAL HX 03/10/14 - did not use cpap last night - refused. Hypoxemia worse.   VITAL SIGNS: Temp:  [98 F (36.7 C)-98.6 F (37 C)] 98.1 F (36.7 C) (10/19 0800) Pulse Rate:  [60-77] 67 (10/19 1115) Resp:  [11-28] 28 (10/19 1115) BP: (145-193)/(79-110) 156/100 mmHg (10/19 1115) SpO2:  [85 %-96 %] 87 % (10/19 1115) FiO2 (%):  [50 %] 50 % (10/19 1115) Weight:  [260 lb 2.3 oz (118 kg)] 260 lb 2.3 oz (118 kg) (10/19 0400)  PHYSICAL EXAMINATION: General:  53 year old male, currently on vmask for low sats. Sitting  up in  Bed Neuro:  Awake, appropriate, no focal def, no motor def  HEENT:  [place], no JVD, mmm  Cardiovascular:  Rrr, brisk CR finger, skin warm  Lungs:  Decreased bs Abdomen:  Distended. + BS, dressings intact , no bs. laproscopic trocar sites healing Musculoskeletal:  Intact  Skin:  Intact     PULMONARY No results found for this basename: PHART, PCO2, PCO2ART, PO2, PO2ART, HCO3, TCO2, O2SAT,  in the last 168 hours  CBC  Recent Labs Lab 03/05/14 0359 03/07/14 1429 03/09/14 1008  HGB 15.2 11.9* 13.1  HCT 42.6 34.9* 38.1*  WBC 18.6* 11.4* 7.0  PLT 287 217 246    COAGULATION No results found for this basename: INR,  in the last 168 hours  CARDIAC    Recent Labs Lab 03/07/14 1429 03/09/14 1008 03/09/14 1554 03/09/14 2114  TROPONINI <0.30 <0.30 <0.30 <0.30    Recent Labs Lab 03/09/14 0551  PROBNP 168.2*     CHEMISTRY  Recent Labs Lab 03/05/14 0359 03/07/14 1429 03/09/14 0551 03/09/14 1008 03/10/14 0005 03/10/14 0337  NA 141 139  --  135* 137  --   K 4.3 4.2  --  3.2* 3.4*  --   CL 101 97  --  92* 93*  --   CO2 23 30  --  28 27  --   GLUCOSE 172* 147*  --  100* 91  --   BUN 13  14  --  11 11  --   CREATININE 1.02 0.83  --  0.76 0.73  --   CALCIUM 9.3 9.1  --  9.1 9.0  --   MG  --   --  2.0 2.0  --  2.0  PHOS  --   --  3.1  --   --  2.9   Estimated Creatinine Clearance: 139 ml/min (by C-G formula based on Cr of 0.73).   LIVER  Recent Labs Lab 03/05/14 0359 03/07/14 1429  AST 19 15  ALT 30 19  ALKPHOS 98 71  BILITOT 0.9 0.5  PROT 8.2 7.3  ALBUMIN 4.2 3.4*     INFECTIOUS No results found for this basename: LATICACIDVEN, PROCALCITON,  in the last 168 hours   ENDOCRINE CBG (last 3)   Recent Labs  03/10/14 0105  GLUCAP 88         IMAGING x48h No results found. Patient Active Problem List   Diagnosis Date Noted  . Atrial fibrillation 03/09/2014  . Acute respiratory failure with hypoxia 03/08/2014  . OSA (obstructive sleep  apnea) 03/06/2014  . Suppurative appendicitis 03/06/2014  . Acute fulminating appendicitis with perforation and peritonitis s/p lap appendectomy 03/05/2014 03/05/2014  . Obesity (BMI 30-39.9) 03/05/2014  . Meckel diverticulum - asymptomatic 03/05/2014     ASSESSMENT / PLAN:  Acute Resp failure Hypoxia due to atelectasis in setting of decreased abd compliance (mix of obesity and abd pain/distention after laparoscopic surgery).  Probable OSA -h/o loud snoring & occ witnessed apneas Doubt PNA   - subjectivelyworse.  Doing IS.FIO2 needs up, refuses bipap  Rec: Push pulmonary hygiene measures: IS and flutter; again encourage Ambulate Wean FIO2 as able QHS auto-set CPAP  - . May need sleep eval  f/u after d/c  LEDS ro dvt  A FIb  - resolved in a few minutes  - trop x 1 negative  PLAN - get echo see report - ensure K  > 4 K+ ordered 10/19   He needs to wear cpap  During the night.   Brett CanalesSteve Minor ACNP [facility] P[facility] Pager [phone] till 3 pm If no answer page [phone] 03/10/2014, 11:56 AM   STAFF NOTE  - personally evaluated patient. Worsening hypoxemia. DUplex legs negative. GOt CT chest -PE ruled out. Atelectasis and mild diast dysfn are the 2 causes. I will change cpap to bipap; encourage him to wear it tonight . Give some lasix. Wife and he updated.Rest per NP  Dr. Kalman ShanMurali Ayn Domangue, M.D., [facility].C.C.P Pulmonary and Critical Care Medicine Staff Physician [place] [facility] [place] Pulmonary and Critical Care Pager: [phone], If no answer or between  15:00h - 7:00h: call [phone]  03/10/2014 6:02 PM

## 2014-03-11 ENCOUNTER — Inpatient Hospital Stay (HOSPITAL_COMMUNITY): Payer: BC Managed Care – PPO

## 2014-03-11 DIAGNOSIS — J9601 Acute respiratory failure with hypoxia: Secondary | ICD-10-CM

## 2014-03-11 LAB — CBC
HCT: 38.4 % — ABNORMAL LOW (ref 39.0–52.0)
Hemoglobin: 13.2 g/dL (ref 13.0–17.0)
MCH: 30.9 pg (ref 26.0–34.0)
MCHC: 34.4 g/dL (ref 30.0–36.0)
MCV: 89.9 fL (ref 78.0–100.0)
Platelets: 267 10*3/uL (ref 150–400)
RBC: 4.27 MIL/uL (ref 4.22–5.81)
RDW: 12.3 % (ref 11.5–15.5)
WBC: 11.1 10*3/uL — ABNORMAL HIGH (ref 4.0–10.5)

## 2014-03-11 LAB — PRO B NATRIURETIC PEPTIDE: Pro B Natriuretic peptide (BNP): 86.2 pg/mL (ref 0–125)

## 2014-03-11 LAB — BASIC METABOLIC PANEL
Anion gap: 16 — ABNORMAL HIGH (ref 5–15)
BUN: 12 mg/dL (ref 6–23)
CALCIUM: 9.2 mg/dL (ref 8.4–10.5)
CO2: 27 mEq/L (ref 19–32)
Chloride: 94 mEq/L — ABNORMAL LOW (ref 96–112)
Creatinine, Ser: 0.82 mg/dL (ref 0.50–1.35)
GFR calc Af Amer: >90 mL/min (ref >90)
Glucose, Bld: 107 mg/dL — ABNORMAL HIGH (ref 70–99)
Potassium: 3.3 mEq/L — ABNORMAL LOW (ref 3.7–5.3)
SODIUM: 137 meq/L (ref 137–147)

## 2014-03-11 LAB — PHOSPHORUS: Phosphorus: 3.2 mg/dL (ref 2.3–4.6)

## 2014-03-11 LAB — MAGNESIUM: MAGNESIUM: 2.1 mg/dL (ref 1.5–2.5)

## 2014-03-11 MED ORDER — SALINE SPRAY 0.65 % NA SOLN
2.0000 | Freq: Two times a day (BID) | NASAL | Status: DC
  Administered 2014-03-11 – 2014-03-12 (×3): 2 via NASAL
  Filled 2014-03-11: qty 44

## 2014-03-11 MED ORDER — POTASSIUM CHLORIDE 10 MEQ/100ML IV SOLN
10.0000 meq | INTRAVENOUS | Status: AC
  Administered 2014-03-11 (×4): 10 meq via INTRAVENOUS
  Filled 2014-03-11 (×4): qty 100

## 2014-03-11 MED ORDER — FLUTICASONE PROPIONATE 50 MCG/ACT NA SUSP
2.0000 | Freq: Every day | NASAL | Status: DC
  Administered 2014-03-11 – 2014-03-13 (×3): 2 via NASAL
  Filled 2014-03-11: qty 16

## 2014-03-11 NOTE — Progress Notes (Signed)
Name: Ernest Riley MRN: [id number] DOB: 1961/03/19    ADMISSION DATE:  03/05/2014 CONSULTATION DATE:  10/16  REFERRING MD :   Zola ButtonWill Jennings PA-C  CHIEF COMPLAINT:   Persistent hypoxia   BRIEF PATIENT DESCRIPTION:  53 y/o white male with h/o Depression and h/o umbilical hernia repair by Dr. Derrell LollingIngram on 07/18/2006 [facility] with right sided abdominal pain which radiates to the RLQ. The pain started at 3pm on 03/04/14 and has progressively worsened as the night went on. Around 1am he was brought to the ER due to 10/10 severe pain. Dx eval was consistent w/ appendicitis. He underwent laparoscopic appendectomy w/ post-op diagnosis of perforated appendix, appendicitis and peritonitis on 10/14. Post-op course has been notable for persistent hypoxia requiring supplemental oxygen via facemask in order to keep sats > 90%, this was worse at HS in the supine position so surgery team tried CPAP during the night of 10/15 w/ good symptomatic effect. PCCM has been asked to see on 10/16 as the pt continues to require oxygen via facemask in order to prevent desaturation.     SIGNIFICANT EVENTS  10/14: admitted w/ RLQ pain and + appendicitis  10/15: CXR w/ low volume.  O2 sats in 80s off facemask. Trial of CPAP at HS-->tolerated well.  10/16: PCCM asked to see for hypoxia  10/19 refuses bipap during the night 10/18 2 d with EF 65% mild LVH 10/20 refused nimvs   SUBJECTIVE/OVERNIGHT/INTERVAL HX 03/11/14 - did not use cpap last night - refused. Hypoxemia worse.   VITAL SIGNS: Temp:  [97.6 F (36.4 C)-98.4 F (36.9 C)] 97.6 F (36.4 C) (10/20 0800) Pulse Rate:  [63-80] 65 (10/20 0900) Resp:  [9-28] 9 (10/20 0900) BP: (103-170)/(87-110) 145/87 mmHg (10/20 0900) SpO2:  [87 %-98 %] 95 % (10/20 0900) FiO2 (%):  [50 %] 50 % (10/20 0800)  PHYSICAL EXAMINATION: General:  53 year old male, currently on Ecru for low sats. Sitting up in  Bed, NAD Neuro:  Awake, appropriate, no focal  def, no motor def , intact HEENT:  [place], no JVD,  Cardiovascular: HSR RRR, brisk CR finger, skin warm  Lungs:  Decreased bs thru out Abdomen:  Distended. + faint BS,  . laproscopic trocar sites healing Musculoskeletal:  Intact  Skin:  Intact     PULMONARY No results found for this basename: PHART, PCO2, PCO2ART, PO2, PO2ART, HCO3, TCO2, O2SAT,  in the last 168 hours  CBC  Recent Labs Lab 03/07/14 1429 03/09/14 1008 03/11/14 0346  HGB 11.9* 13.1 13.2  HCT 34.9* 38.1* 38.4*  WBC 11.4* 7.0 11.1*  PLT 217 246 267    COAGULATION No results found for this basename: INR,  in the last 168 hours  CARDIAC    Recent Labs Lab 03/07/14 1429 03/09/14 1008 03/09/14 1554 03/09/14 2114  TROPONINI <0.30 <0.30 <0.30 <0.30    Recent Labs Lab 03/09/14 0551 03/11/14 0346  PROBNP 168.2* 86.2     CHEMISTRY  Recent Labs Lab 03/05/14 0359 03/07/14 1429 03/09/14 0551 03/09/14 1008 03/10/14 0005 03/10/14 0337 03/11/14 0346  NA 141 139  --  135* 137  --  137  K 4.3 4.2  --  3.2* 3.4*  --  3.3*  CL 101 97  --  92* 93*  --  94*  CO2 23 30  --  28 27  --  27  GLUCOSE 172* 147*  --  100* 91  --  107*  BUN 13 14  --  11 11  --  12  CREATININE 1.02 0.83  --  0.76 0.73  --  0.82  CALCIUM 9.3 9.1  --  9.1 9.0  --  9.2  MG  --   --  2.0 2.0  --  2.0 2.1  PHOS  --   --  3.1  --   --  2.9 3.2   Estimated Creatinine Clearance: 135.6 ml/min (by C-G formula based on Cr of 0.82).   LIVER  Recent Labs Lab 03/05/14 0359 03/07/14 1429  AST 19 15  ALT 30 19  ALKPHOS 98 71  BILITOT 0.9 0.5  PROT 8.2 7.3  ALBUMIN 4.2 3.4*     INFECTIOUS No results found for this basename: LATICACIDVEN, PROCALCITON,  in the last 168 hours   ENDOCRINE CBG (last 3)   Recent Labs  03/10/14 0105  GLUCAP 88         IMAGING x48h Ct Angio Chest Pe W/cm &/or Wo Cm  03/10/2014   CLINICAL DATA:  Acute respiratory failure, oxygen desaturation, acute shortness of breath  EXAM: CT  ANGIOGRAPHY CHEST WITH CONTRAST  TECHNIQUE: Multidetector CT imaging of the chest was performed using the standard protocol during bolus administration of intravenous contrast. Multiplanar CT image reconstructions and MIPs were obtained to evaluate the vascular anatomy.  CONTRAST:  OMNIPAQUE IOHEXOL 350 MG/ML SOLN  COMPARISON:  None.  FINDINGS: Aorta normal caliber without aneurysm or dissection.  Heart appears mildly enlarged.  Atherosclerotic calcifications at coronary arteries.  Assessment of lower lobes degraded by motion artifacts and atelectasis.  Pulmonary arteries appear grossly patent.  No evidence of pulmonary embolism.  Scattered bibasilar atelectasis greater in RIGHT middle and RIGHT lower lobes.  Minimal increased attenuation centrally within the upper lobes could reflect minimal infection or edema.  No pleural effusion or pneumothorax.  No acute osseous findings.  Review of the MIP images confirms the above findings.  IMPRESSION: No evidence of pulmonary embolism.  Bibasilar atelectasis greater on RIGHT.  Question infiltrate centrally in upper lobes bilaterally, could reflect minimal edema or infection.   Electronically Signed   By: Ulyses Southward M.D.   On: 03/10/2014 14:19   Dg Chest Port 1 View  03/11/2014   CLINICAL DATA:  Respiratory failure  EXAM: PORTABLE CHEST - 1 VIEW  COMPARISON:  CTA chest dated 03/10/2014  FINDINGS: Low lung volumes. Mild patchy bibasilar opacities, right greater than left, likely atelectasis. Suspected small left pleural effusion. No pneumothorax.  Heart is top-normal in size for inspiration.  IMPRESSION: Low lung volumes.  Patchy bilateral lower lobe opacities, likely atelectasis.  Suspected small left pleural effusion.   Electronically Signed   By: Charline Bills M.D.   On: 03/11/2014 07:55   Patient Active Problem List   Diagnosis Date Noted  . Atrial fibrillation 03/09/2014  . Acute respiratory failure with hypoxia 03/08/2014  . OSA (obstructive sleep  apnea) 03/06/2014  . Suppurative appendicitis 03/06/2014  . Acute fulminating appendicitis with perforation and peritonitis s/p lap appendectomy 03/05/2014 03/05/2014  . Obesity (BMI 30-39.9) 03/05/2014  . Meckel diverticulum - asymptomatic 03/05/2014     ASSESSMENT / PLAN:  Acute Resp failure Hypoxia due to atelectasis in setting of decreased abd compliance (mix of obesity and abd pain/distention after laparoscopic surgery).  Probable OSA -h/o loud snoring & occ witnessed apneas Doubt PNA   - subjectivelyworse.  Doing IS.FIO2 needs up, refuses bipap  Rec: Push pulmonary hygiene measures: IS and flutter; again encourage Ambulate Wean FIO2 as able QHS  auto-set CPAP (refuses)  - . May need sleep eval  f/u after d/c, if he would consent.  LEDS ro dvt  A FIb  - resolved in a few minutes  - trop x 1 negative  PLAN - get echo see report - ensure K  > 4 K+ ordered 10/20   He needs to wear cpap  During the night. But refuses. Consider full face mask   Brett CanalesSteve Minor ACNP [facility] Pager [phone] till 3 pm If no answer page [phone] 03/11/2014, 10:14 AM      STAFF NOTE: I, Dr Lavinia SharpsM Miryah Ralls have personally reviewed patient's available data, including medical history, events of note, physical examination and test results as part of my evaluation. I have discussed with resident/NP and other care providers such as pharmacist, RN and RRT.  In addition,  I personally evaluated patient and elicited key findings of *- improved hypoxemia , PE ruled out. He is not tolerating bipap due to nasal blockage. Will start nasal inhalers but dc bipap. Encouraged pulm toilet with which he is not compliant.  Rest per NP/medical resident whose note is outlined above and that I agree with    Dr. Kalman ShanMurali Chihiro Frey, M.D., [facility] FoundationF.C.C.P Pulmonary and Critical Care Medicine Staff Physician [facility] [place] [facility] Pager: [phone], If no answer or between  15:00h -  7:00h: call [phone]  03/11/2014 12:32 PM

## 2014-03-11 NOTE — Progress Notes (Signed)
6 Days Post-Op  Subjective: Vomited again yesterday, and day before, still having trouble with saturations.  Still taking dilaudid for pain.  None of this really adds up.  Negative chest CT for PE, normal EF on Echo, normal BNP/Trop.   Objective: Vital signs in last 24 hours: Temp:  [97.6 F (36.4 C)-98.4 F (36.9 C)] 97.6 F (36.4 C) (10/20 0800) Pulse Rate:  [63-80] 65 (10/20 0900) Resp:  [9-20] 9 (10/20 0900) BP: (103-161)/(87-103) 145/87 mmHg (10/20 0900) SpO2:  [91 %-98 %] 95 % (10/20 0900) FiO2 (%):  [50 %] 50 % (10/20 0800) Last BM Date: 03/10/14 360 PO Clear diet Having 4 BM 2 days ago and one yesterday On FM or nasal cannula to maintain sats K+ 3.3 WBC 11.1 Trop and BNP are normal Film continue to show bilat lower lobe opacities, ? Atelectasis, small left effusion CT scan negative for PE on 03/10/14,Bibasilar atelectasis greater on RIGHT. Question infiltrate centrally in upper lobes bilaterally, could reflect minimal edema or infection.  Intake/Output from previous day: 10/19 0701 - 10/20 0700 In: 1335 [P.O.:360; I.V.:225; IV Piggyback:750] Out: 2250 [Urine:2250] Intake/Output this shift: Total I/O In: 30 [I.V.:30] Out: 375 [Urine:375]  General appearance: alert, cooperative, no distress and tired of being in hospital.  Resp: clear to auscultation bilaterally and upper chest, decreased some in the bases GI: still has some tenderness RUQ and LLQ  Lab Results:   Recent Labs  03/09/14 1008 03/11/14 0346  WBC 7.0 11.1*  HGB 13.1 13.2  HCT 38.1* 38.4*  PLT 246 267    BMET  Recent Labs  03/10/14 0005 03/11/14 0346  NA 137 137  K 3.4* 3.3*  CL 93* 94*  CO2 27 27  GLUCOSE 91 107*  BUN 11 12  CREATININE 0.73 0.82  CALCIUM 9.0 9.2   PT/INR No results found for this basename: LABPROT, INR,  in the last 72 hours   Recent Labs Lab 03/05/14 0359 03/07/14 1429  AST 19 15  ALT 30 19  ALKPHOS 98 71  BILITOT 0.9 0.5  PROT 8.2 7.3  ALBUMIN 4.2  3.4*     Lipase     Component Value Date/Time   LIPASE 23 03/05/2014 0359     Studies/Results: Ct Angio Chest Pe W/cm &/or Wo Cm  03/10/2014   CLINICAL DATA:  Acute respiratory failure, oxygen desaturation, acute shortness of breath  EXAM: CT ANGIOGRAPHY CHEST WITH CONTRAST  TECHNIQUE: Multidetector CT imaging of the chest was performed using the standard protocol during bolus administration of intravenous contrast. Multiplanar CT image reconstructions and MIPs were obtained to evaluate the vascular anatomy.  CONTRAST:  100mL OMNIPAQUE IOHEXOL 350 MG/ML SOLN  COMPARISON:  None.  FINDINGS: Aorta normal caliber without aneurysm or dissection.  Heart appears mildly enlarged.  Atherosclerotic calcifications at coronary arteries.  Assessment of lower lobes degraded by motion artifacts and atelectasis.  Pulmonary arteries appear grossly patent.  No evidence of pulmonary embolism.  Scattered bibasilar atelectasis greater in RIGHT middle and RIGHT lower lobes.  Minimal increased attenuation centrally within the upper lobes could reflect minimal infection or edema.  No pleural effusion or pneumothorax.  No acute osseous findings.  Review of the MIP images confirms the above findings.  IMPRESSION: No evidence of pulmonary embolism.  Bibasilar atelectasis greater on RIGHT.  Question infiltrate centrally in upper lobes bilaterally, could reflect minimal edema or infection.   Electronically Signed   By: Ulyses SouthwardMark  Boles M.D.   On: 03/10/2014 14:19   Dg Chest [facility]  1 View  03/11/2014   CLINICAL DATA:  Respiratory failure  EXAM: PORTABLE CHEST - 1 VIEW  COMPARISON:  CTA chest dated 03/10/2014  FINDINGS: Low lung volumes. Mild patchy bibasilar opacities, right greater than left, likely atelectasis. Suspected small left pleural effusion. No pneumothorax.  Heart is top-normal in size for inspiration.  IMPRESSION: Low lung volumes.  Patchy bilateral lower lobe opacities, likely atelectasis.  Suspected small left pleural  effusion.   Electronically Signed   By: Charline BillsSriyesh  Krishnan M.D.   On: 03/11/2014 07:55    Medications: . acetaminophen  1,000 mg Oral TID  . antiseptic oral rinse  7 mL Mouth Rinse q12n4p  . cefTRIAXone (ROCEPHIN)  IV  2 g Intravenous Q24H  . chlorhexidine  15 mL Mouth Rinse BID  . citalopram  20 mg Oral Daily  . enoxaparin (LOVENOX) injection  40 mg Subcutaneous Q24H  . lip balm  1 application Topical BID  . metronidazole  500 mg Intravenous Q6H  . potassium chloride  10 mEq Intravenous Q1 Hr x 4  . saccharomyces boulardii  250 mg Oral BID    Assessment/Plan Perforated Appendicitis with suppuration  APPENDECTOMY LAPAROSCOPIC, 03/05/2014, Karie SodaSteven Gross, MD  Post op respiratory distress/volume over load vs OSA  Obesity Body mass index is 35.78 kg/(m^2).  OSA  Hx of depression  Hx of ETOH use SCD/Lovenox for DVT    Plan;  I don't have a good explanation of what is occuring, and why he has not progressed more. He has had 6 days of flagyl and rocephin.  i will discuss with Dr. Johna SheriffHoxworth.  ? Repeat abdominal CT?    LOS: 6 days    Cella Cappello 03/11/2014

## 2014-03-11 NOTE — Progress Notes (Signed)
Informed by RN that Pt had removed his mask and was refusing to put it back on.  Pt placed back on venturi mask.  Benefits and risks discussed with Pt and he vocalized his understanding.

## 2014-03-12 ENCOUNTER — Inpatient Hospital Stay (HOSPITAL_COMMUNITY): Payer: BC Managed Care – PPO

## 2014-03-12 DIAGNOSIS — E669 Obesity, unspecified: Secondary | ICD-10-CM

## 2014-03-12 LAB — PHOSPHORUS: Phosphorus: 3.3 mg/dL (ref 2.3–4.6)

## 2014-03-12 LAB — BASIC METABOLIC PANEL
Anion gap: 14 (ref 5–15)
BUN: 10 mg/dL (ref 6–23)
CALCIUM: 8.8 mg/dL (ref 8.4–10.5)
CO2: 26 meq/L (ref 19–32)
CREATININE: 0.85 mg/dL (ref 0.50–1.35)
Chloride: 95 mEq/L — ABNORMAL LOW (ref 96–112)
GFR calc Af Amer: >90 mL/min (ref >90)
GFR calc non Af Amer: >90 mL/min (ref >90)
GLUCOSE: 101 mg/dL — AB (ref 70–99)
Potassium: 3.5 mEq/L — ABNORMAL LOW (ref 3.7–5.3)
Sodium: 135 mEq/L — ABNORMAL LOW (ref 137–147)

## 2014-03-12 LAB — CBC
HCT: 38.8 % — ABNORMAL LOW (ref 39.0–52.0)
Hemoglobin: 13.6 g/dL (ref 13.0–17.0)
MCH: 31.3 pg (ref 26.0–34.0)
MCHC: 35.1 g/dL (ref 30.0–36.0)
MCV: 89.4 fL (ref 78.0–100.0)
PLATELETS: 251 10*3/uL (ref 150–400)
RBC: 4.34 MIL/uL (ref 4.22–5.81)
RDW: 12.3 % (ref 11.5–15.5)
WBC: 11.1 10*3/uL — AB (ref 4.0–10.5)

## 2014-03-12 LAB — MAGNESIUM: Magnesium: 2 mg/dL (ref 1.5–2.5)

## 2014-03-12 LAB — SEDIMENTATION RATE: Sed Rate: 40 mm/hr — ABNORMAL HIGH (ref 0–16)

## 2014-03-12 LAB — URIC ACID: Uric Acid, Serum: 11.1 mg/dL — ABNORMAL HIGH (ref 4.0–7.8)

## 2014-03-12 MED ORDER — NAPROXEN 500 MG PO TABS
500.0000 mg | ORAL_TABLET | Freq: Two times a day (BID) | ORAL | Status: DC
  Administered 2014-03-12 – 2014-03-13 (×3): 500 mg via ORAL
  Filled 2014-03-12 (×5): qty 1

## 2014-03-12 MED ORDER — LIDOCAINE HCL (PF) 1 % IJ SOLN
5.0000 mL | Freq: Once | INTRAMUSCULAR | Status: AC
  Administered 2014-03-12: 5 mL
  Filled 2014-03-12: qty 5

## 2014-03-12 MED ORDER — POTASSIUM CHLORIDE CRYS ER 20 MEQ PO TBCR
20.0000 meq | EXTENDED_RELEASE_TABLET | Freq: Once | ORAL | Status: AC
  Administered 2014-03-12: 20 meq via ORAL
  Filled 2014-03-12: qty 1

## 2014-03-12 MED ORDER — METHYLPREDNISOLONE ACETATE 40 MG/ML IJ SUSP
40.0000 mg | Freq: Once | INTRAMUSCULAR | Status: AC
  Administered 2014-03-12: 40 mg via INTRA_ARTICULAR
  Filled 2014-03-12: qty 1

## 2014-03-12 MED ORDER — SODIUM CHLORIDE 0.9 % IJ SOLN
3.0000 mL | Freq: Two times a day (BID) | INTRAMUSCULAR | Status: DC
  Administered 2014-03-12 – 2014-03-13 (×3): 3 mL via INTRAVENOUS

## 2014-03-12 MED ORDER — SODIUM CHLORIDE 0.9 % IJ SOLN: 3.0000 mL | INTRAMUSCULAR | Status: DC | PRN

## 2014-03-12 NOTE — Progress Notes (Signed)
Report called and given to kim O, rn on third flr. All questions answered to her satisfaction. Vwilliams,rn.

## 2014-03-12 NOTE — Consult Note (Signed)
Reason for Consult: knee pain Referring Physician: CCS  Ernest Riley is an 53 y.o. male.  HPI: S/P appendectomy. Had some knee pain prior. Worse now. No trauma.  Past Medical History  Diagnosis Date  . Depression     Past Surgical History  Procedure Laterality Date  . Umbilical hernia repair  07/18/2006    Dr. Dalbert Batman  . Laparoscopic appendectomy N/A 03/05/2014    Procedure: APPENDECTOMY LAPAROSCOPIC;  Surgeon: Michael Boston, MD;  Location: [facility];  Service: General;  Laterality: N/A;    History reviewed. No pertinent family history.  Social History:  reports that he has never smoked. He has never used smokeless tobacco. He reports that he drinks alcohol. He reports that he does not use illicit drugs.  Allergies:  Allergies  Allergen Reactions  . Sulfa Antibiotics Swelling    Medications: I have reviewed the patient's current medications.  Results for orders placed during the hospital encounter of 03/05/14 (from the past 48 hour(s))  MAGNESIUM     Status: None   Collection Time    03/11/14  3:46 AM      Result Value Ref Range   Magnesium 2.1  1.5 - 2.5 mg/dL  PHOSPHORUS     Status: None   Collection Time    03/11/14  3:46 AM      Result Value Ref Range   Phosphorus 3.2  2.3 - 4.6 mg/dL  BASIC METABOLIC PANEL     Status: Abnormal   Collection Time    03/11/14  3:46 AM      Result Value Ref Range   Sodium 137  137 - 147 mEq/L   Potassium 3.3 (*) 3.7 - 5.3 mEq/L   Chloride 94 (*) 96 - 112 mEq/L   CO2 27  19 - 32 mEq/L   Glucose, Bld 107 (*) 70 - 99 mg/dL   BUN 12  6 - 23 mg/dL   Creatinine, Ser 0.82  0.50 - 1.35 mg/dL   Calcium 9.2  8.4 - 10.5 mg/dL   GFR calc non Af Amer >90  >90 mL/min   GFR calc Af Amer >90  >90 mL/min   Comment: (NOTE)     The eGFR has been calculated using the CKD EPI equation.     This calculation has not been validated in all clinical situations.     eGFR's persistently <90 mL/min signify possible Chronic Kidney     Disease.   Anion  gap 16 (*) 5 - 15  PRO B NATRIURETIC PEPTIDE     Status: None   Collection Time    03/11/14  3:46 AM      Result Value Ref Range   Pro B Natriuretic peptide (BNP) 86.2  0 - 125 pg/mL  CBC     Status: Abnormal   Collection Time    03/11/14  3:46 AM      Result Value Ref Range   WBC 11.1 (*) 4.0 - 10.5 K/uL   RBC 4.27  4.22 - 5.81 MIL/uL   Hemoglobin 13.2  13.0 - 17.0 g/dL   HCT 38.4 (*) 39.0 - 52.0 %   MCV 89.9  78.0 - 100.0 fL   MCH 30.9  26.0 - 34.0 pg   MCHC 34.4  30.0 - 36.0 g/dL   RDW 12.3  11.5 - 15.5 %   Platelets 267  150 - 400 K/uL  MAGNESIUM     Status: None   Collection Time    03/12/14  1:52 AM  Result Value Ref Range   Magnesium 2.0  1.5 - 2.5 mg/dL  PHOSPHORUS     Status: None   Collection Time    03/12/14  1:52 AM      Result Value Ref Range   Phosphorus 3.3  2.3 - 4.6 mg/dL  BASIC METABOLIC PANEL     Status: Abnormal   Collection Time    03/12/14  1:52 AM      Result Value Ref Range   Sodium 135 (*) 137 - 147 mEq/L   Potassium 3.5 (*) 3.7 - 5.3 mEq/L   Chloride 95 (*) 96 - 112 mEq/L   CO2 26  19 - 32 mEq/L   Glucose, Bld 101 (*) 70 - 99 mg/dL   BUN 10  6 - 23 mg/dL   Creatinine, Ser 0.85  0.50 - 1.35 mg/dL   Calcium 8.8  8.4 - 10.5 mg/dL   GFR calc non Af Amer >90  >90 mL/min   GFR calc Af Amer >90  >90 mL/min   Comment: (NOTE)     The eGFR has been calculated using the CKD EPI equation.     This calculation has not been validated in all clinical situations.     eGFR's persistently <90 mL/min signify possible Chronic Kidney     Disease.   Anion gap 14  5 - 15  CBC     Status: Abnormal   Collection Time    03/12/14  2:05 PM      Result Value Ref Range   WBC 11.1 (*) 4.0 - 10.5 K/uL   RBC 4.34  4.22 - 5.81 MIL/uL   Hemoglobin 13.6  13.0 - 17.0 g/dL   HCT 38.8 (*) 39.0 - 52.0 %   MCV 89.4  78.0 - 100.0 fL   MCH 31.3  26.0 - 34.0 pg   MCHC 35.1  30.0 - 36.0 g/dL   RDW 12.3  11.5 - 15.5 %   Platelets 251  150 - 400 K/uL    Dg Knee 1-2  Views Right  03/12/2014   CLINICAL DATA:  Acute right anterior and medial knee pain and swelling.  EXAM: RIGHT KNEE - 1-2 VIEW  COMPARISON:  None.  FINDINGS: There is no joint effusion identified. Patellofemoral joint space narrowing is noted. There is mild patellofemoral marginal spur formation in sharp in the tibial spines. Small lucency along the undersurface of the proximal pole of the patella measures 4 mm. No fractures or dislocations identified.  IMPRESSION: 1. Osteoarthritis, this predominantly involves the patellofemoral compartment. 2. Cannot rule out small osteochondral defect involving the patella   Electronically Signed   By: Kerby Moors M.D.   On: 03/12/2014 11:26   Dg Chest Port 1 View  03/12/2014   CLINICAL DATA:  53 year old with persistent hypoxia. History of perforated appendicitis.  EXAM: PORTABLE CHEST - 1 VIEW  COMPARISON:  03/11/2014 and chest CT 02/1914  FINDINGS: There are persistent streaky densities at both lung bases. Findings are most compatible with subsegmental atelectasis. Overall, there are low lung volumes. Upper lungs remain clear. Heart size is grossly stable. The trachea is midline. Negative for a pneumothorax.  IMPRESSION: Persistent low lung volumes with bibasilar opacities. Findings are most compatible with atelectasis.   Electronically Signed   By: Markus Daft M.D.   On: 03/12/2014 07:51   Dg Chest Port 1 View  03/11/2014   CLINICAL DATA:  Respiratory failure  EXAM: PORTABLE CHEST - 1 VIEW  COMPARISON:  CTA chest dated 03/10/2014  FINDINGS: Low lung volumes. Mild patchy bibasilar opacities, right greater than left, likely atelectasis. Suspected small left pleural effusion. No pneumothorax.  Heart is top-normal in size for inspiration.  IMPRESSION: Low lung volumes.  Patchy bilateral lower lobe opacities, likely atelectasis.  Suspected small left pleural effusion.   Electronically Signed   By: Julian Hy M.D.   On: 03/11/2014 07:55    Review of Systems   Musculoskeletal: Positive for joint pain.  All other systems reviewed and are negative.  Blood pressure 141/74, pulse 80, temperature 98.6 F (37 C), temperature source Oral, resp. rate 18, height _0  (1.778 m), weight 108.6 kg (239 lb 6.7 oz), SpO2 98.00%. Physical Exam  Constitutional: He is oriented to person, place, and time. He appears well-developed.  HENT:  Head: Normocephalic.  Eyes: Pupils are equal, round, and reactive to light.  Neck: Normal range of motion.  Cardiovascular: Normal rate.   Respiratory: Effort normal.  GI: Soft.  Musculoskeletal:  Right knee pain medial joint. Small effusion. No erythema. Slight pain with ROM. No DVT.  Neurological: He is alert and oriented to person, place, and time. He has normal reflexes.  Skin: Skin is warm.  Psychiatric: He has a normal mood and affect.    Assessment/Plan:  Symptomatic DJD right knee. No sign of infection or Gout. Injected knee 14m depomedrol after strict  sterile prep And sterile technique. Crutches as needed. 1-2 days until effect. Ice See Dr. BTonita Congas OPT in 2 weeks  Giada Schoppe C 03/12/2014, 2:38 PM

## 2014-03-12 NOTE — Progress Notes (Signed)
Patient interviewed and examined, agree with NP note above. Continues to progress overall. His knee appears to have an effusion and is limiting mobility. We have asked orthopedics to evaluate.  Mariella SaaBenjamin T Paullette Mckain MD, FACS  03/12/2014 6:13 PM

## 2014-03-12 NOTE — Progress Notes (Signed)
Patient ID: Ernest Riley, male   DOB: [birth year], 53 y.o.   MRN: [id number]     [facility]      [address] [place], [place] [address]    Phone: [phone] FAX: [phone]     Subjective: BM last night, no sob, cp.  No n/v.  Tolerated clear liquids. Stable sats.  CXR with atelectasis.    Objective:  Vital signs:  Filed Vitals:   03/11/14 2300 03/12/14 0000 03/12/14 0400 03/12/14 0435  BP: 143/91 159/85  160/99  Pulse: 65 61  76  Temp:  97.5 F (36.4 C) 97.3 F (36.3 C)   TempSrc:  Oral Oral   Resp: 14 15  14   Height:      Weight:   239 lb 6.7 oz (108.6 kg)   SpO2: 92% 94%  90%    Last BM Date: 03/11/14  Intake/Output   Yesterday:  10/20 0701 - 10/21 0700 In: 0272 [P.O.:480; I.V.:240; IV Piggyback:850] Out: 1450 [Urine:1450] This shift:    I/O last 3 completed shifts: In: 2240 [P.O.:840; I.V.:350; IV Piggyback:1050] Out: 1800 [Urine:1800]     Physical Exam: General: Pt awake/alert/oriented x4 in no acute distress Chest: cta.  No chest wall pain w good excursion CV:  Pulses intact.  Regular rhythm Abdomen: Soft.  Nondistended. Mild ttp LLQ.  Incisions are c/d/i.   No evidence of peritonitis.  No incarcerated hernias. Ext:  Moderate swelling right knee, ttp medial aspect of the knee, normal ROM, pain with passive ROM Skin: No petechiae / purpura   Problem List:   Principal Problem:   Acute fulminating appendicitis with perforation and peritonitis s/p lap appendectomy 03/05/2014 Active Problems:   Obesity (BMI 30-39.9)   Meckel diverticulum - asymptomatic   OSA (obstructive sleep apnea)   Suppurative appendicitis   Acute respiratory failure with hypoxia   Atrial fibrillation    Results:   Labs: Results for orders placed during the hospital encounter of 03/05/14 (from the past 48 hour(s))  MAGNESIUM     Status: None   Collection Time    03/11/14  3:46 AM      Result Value Ref Range    Magnesium 2.1  1.5 - 2.5 mg/dL  PHOSPHORUS     Status: None   Collection Time    03/11/14  3:46 AM      Result Value Ref Range   Phosphorus 3.2  2.3 - 4.6 mg/dL  BASIC METABOLIC PANEL     Status: Abnormal   Collection Time    03/11/14  3:46 AM      Result Value Ref Range   Sodium 137  137 - 147 mEq/L   Potassium 3.3 (*) 3.7 - 5.3 mEq/L   Chloride 94 (*) 96 - 112 mEq/L   CO2 27  19 - 32 mEq/L   Glucose, Bld 107 (*) 70 - 99 mg/dL   BUN 12  6 - 23 mg/dL   Creatinine, Ser 0.82  0.50 - 1.35 mg/dL   Calcium 9.2  8.4 - 10.5 mg/dL   GFR calc non Af Amer >90  >90 mL/min   GFR calc Af Amer >90  >90 mL/min   Comment: (NOTE)     The eGFR has been calculated using the CKD EPI equation.     This calculation has not been validated in all clinical situations.     eGFR's persistently <90 mL/min signify possible Chronic Kidney     Disease.   Anion  gap 16 (*) 5 - 15  PRO B NATRIURETIC PEPTIDE     Status: None   Collection Time    03/11/14  3:46 AM      Result Value Ref Range   Pro B Natriuretic peptide (BNP) 86.2  0 - 125 pg/mL  CBC     Status: Abnormal   Collection Time    03/11/14  3:46 AM      Result Value Ref Range   WBC 11.1 (*) 4.0 - 10.5 K/uL   RBC 4.27  4.22 - 5.81 MIL/uL   Hemoglobin 13.2  13.0 - 17.0 g/dL   HCT 38.4 (*) 39.0 - 52.0 %   MCV 89.9  78.0 - 100.0 fL   MCH 30.9  26.0 - 34.0 pg   MCHC 34.4  30.0 - 36.0 g/dL   RDW 12.3  11.5 - 15.5 %   Platelets 267  150 - 400 K/uL  MAGNESIUM     Status: None   Collection Time    03/12/14  1:52 AM      Result Value Ref Range   Magnesium 2.0  1.5 - 2.5 mg/dL  PHOSPHORUS     Status: None   Collection Time    03/12/14  1:52 AM      Result Value Ref Range   Phosphorus 3.3  2.3 - 4.6 mg/dL  BASIC METABOLIC PANEL     Status: Abnormal   Collection Time    03/12/14  1:52 AM      Result Value Ref Range   Sodium 135 (*) 137 - 147 mEq/L   Potassium 3.5 (*) 3.7 - 5.3 mEq/L   Chloride 95 (*) 96 - 112 mEq/L   CO2 26  19 - 32 mEq/L    Glucose, Bld 101 (*) 70 - 99 mg/dL   BUN 10  6 - 23 mg/dL   Creatinine, Ser 0.85  0.50 - 1.35 mg/dL   Calcium 8.8  8.4 - 10.5 mg/dL   GFR calc non Af Amer >90  >90 mL/min   GFR calc Af Amer >90  >90 mL/min   Comment: (NOTE)     The eGFR has been calculated using the CKD EPI equation.     This calculation has not been validated in all clinical situations.     eGFR's persistently <90 mL/min signify possible Chronic Kidney     Disease.   Anion gap 14  5 - 15    Imaging / Studies: Ct Angio Chest Pe W/cm &/or Wo Cm  03/10/2014   CLINICAL DATA:  Acute respiratory failure, oxygen desaturation, acute shortness of breath  EXAM: CT ANGIOGRAPHY CHEST WITH CONTRAST  TECHNIQUE: Multidetector CT imaging of the chest was performed using the standard protocol during bolus administration of intravenous contrast. Multiplanar CT image reconstructions and MIPs were obtained to evaluate the vascular anatomy.  CONTRAST:  132m OMNIPAQUE IOHEXOL 350 MG/ML SOLN  COMPARISON:  None.  FINDINGS: Aorta normal caliber without aneurysm or dissection.  Heart appears mildly enlarged.  Atherosclerotic calcifications at coronary arteries.  Assessment of lower lobes degraded by motion artifacts and atelectasis.  Pulmonary arteries appear grossly patent.  No evidence of pulmonary embolism.  Scattered bibasilar atelectasis greater in RIGHT middle and RIGHT lower lobes.  Minimal increased attenuation centrally within the upper lobes could reflect minimal infection or edema.  No pleural effusion or pneumothorax.  No acute osseous findings.  Review of the MIP images confirms the above findings.  IMPRESSION: No evidence of pulmonary embolism.  Bibasilar  atelectasis greater on RIGHT.  Question infiltrate centrally in upper lobes bilaterally, could reflect minimal edema or infection.   Electronically Signed   By: Lavonia Dana M.D.   On: 03/10/2014 14:19   Dg Chest Port 1 View  03/12/2014   CLINICAL DATA:  53 year old with persistent  hypoxia. History of perforated appendicitis.  EXAM: PORTABLE CHEST - 1 VIEW  COMPARISON:  03/11/2014 and chest CT 02/1914  FINDINGS: There are persistent streaky densities at both lung bases. Findings are most compatible with subsegmental atelectasis. Overall, there are low lung volumes. Upper lungs remain clear. Heart size is grossly stable. The trachea is midline. Negative for a pneumothorax.  IMPRESSION: Persistent low lung volumes with bibasilar opacities. Findings are most compatible with atelectasis.   Electronically Signed   By: Markus Daft M.D.   On: 03/12/2014 07:51   Dg Chest Port 1 View  03/11/2014   CLINICAL DATA:  Respiratory failure  EXAM: PORTABLE CHEST - 1 VIEW  COMPARISON:  CTA chest dated 03/10/2014  FINDINGS: Low lung volumes. Mild patchy bibasilar opacities, right greater than left, likely atelectasis. Suspected small left pleural effusion. No pneumothorax.  Heart is top-normal in size for inspiration.  IMPRESSION: Low lung volumes.  Patchy bilateral lower lobe opacities, likely atelectasis.  Suspected small left pleural effusion.   Electronically Signed   By: Julian Hy M.D.   On: 03/11/2014 07:55    Medications / Allergies:  Scheduled Meds: . acetaminophen  1,000 mg Oral TID  . antiseptic oral rinse  7 mL Mouth Rinse q12n4p  . cefTRIAXone (ROCEPHIN)  IV  2 g Intravenous Q24H  . chlorhexidine  15 mL Mouth Rinse BID  . citalopram  20 mg Oral Daily  . enoxaparin (LOVENOX) injection  40 mg Subcutaneous Q24H  . fluticasone  2 spray Each Nare Daily  . lip balm  1 application Topical BID  . metronidazole  500 mg Intravenous Q6H  . saccharomyces boulardii  250 mg Oral BID  . sodium chloride  2 spray Each Nare BID   Continuous Infusions: . 0.9 % NaCl with KCl 20 mEq / L 10 mL/hr at 03/12/14 0854   PRN Meds:.alum & mag hydroxide-simeth, bisacodyl, HYDROmorphone (DILAUDID) injection, magic mouthwash, menthol-cetylpyridinium, metoprolol, ondansetron, oxyCODONE, phenol,  promethazine  Antibiotics: Anti-infectives   Start     Dose/Rate Route Frequency Ordered Stop   03/05/14 1700  metroNIDAZOLE (FLAGYL) IVPB 500 mg     500 mg 100 mL/hr over 60 Minutes Intravenous Every 6 hours 03/05/14 1533     03/05/14 1600  piperacillin-tazobactam (ZOSYN) IVPB 3.375 g     3.375 g 12.5 mL/hr over 240 Minutes Intravenous  Once 03/05/14 1533 03/05/14 2311   03/05/14 1600  cefTRIAXone (ROCEPHIN) 2 g in dextrose 5 % 50 mL IVPB    Comments:  Pharmacy may adjust dosing strength / duration / interval for maximal efficacy   2 g 100 mL/hr over 30 Minutes Intravenous Every 24 hours 03/05/14 1533     03/05/14 0815  piperacillin-tazobactam (ZOSYN) IVPB 3.375 g  Status:  Discontinued     3.375 g 12.5 mL/hr over 240 Minutes Intravenous 4 times per day 03/05/14 0803 03/05/14 0803   03/05/14 0630  [MAR Hold]  piperacillin-tazobactam (ZOSYN) IVPB 3.375 g     (On MAR Hold since 03/05/14 0836)   3.375 g 12.5 mL/hr over 240 Minutes Intravenous  Once 03/05/14 0620 03/05/14 1054   03/05/14 0000  amoxicillin-clavulanate (AUGMENTIN) 875-125 MG per tablet     1 tablet  Oral 2 times daily 03/05/14 1122         Assessment/Plan perforated appendicitis with suppuration  POD#7 laparoscopic appendectomy---Dr. Johney Maine 03/05/14 -advance to full liquid diet, advance as tolerated.  Had a BM last night, less pain, afebrile. -IS(1720m) -mobilize -SCD/lovenox -D#7 rocephin/flagyl.  ?duration/change to PO Hypoxia Questionable OSA-refusing CPAP, will need a sleep study on outpatient basis.  He is followed by Dr. OMaxwell Caul-transfer to floor -continuous pulse ox at night Right knee swelling, likely OA, no trauma, no hx of gout -obtain plain films -mobilize -RICE -naproxen Hypokalemia -give KCL 217m -repeat labs in AM Depression-home meds dispo-transfer to floor, continuous pulse ox, anticipate discharge 1-2 days   EmErby PianANWalnut Creek Endoscopy Center LLCurgery Pager  [phone]([id number]:30P)   03/12/2014 9:24 AM

## 2014-03-12 NOTE — Progress Notes (Signed)
Pt transferred to third flr rm 1344. Left unit in wheelchair pushed by nurse tech. Left in good condition. Vwilliams,rn.

## 2014-03-12 NOTE — Progress Notes (Signed)
Pt transferred to room 1344. Pt oriented to room. Denies pain. Will continue to monitor

## 2014-03-12 NOTE — Progress Notes (Signed)
Have discussed with Dr. Tonita Cong about patients right knee pain, swelling and him not wanting to weight bear due to pain.  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  He asked me to order uric acid, CBC, and ESR and to get aspiration materials ordered to the bedside.  I have placed orders for these supplies and have talked to the covering nurse the patient.    Coralie Keens, [facility] [phone]

## 2014-03-12 NOTE — Discharge Instructions (Signed)
LAPAROSCOPIC SURGERY: POST OP INSTRUCTIONS ° °1. DIET: Follow a light bland diet the first 24 hours after arrival home, such as soup, liquids, crackers, etc.  Be sure to include lots of fluids daily.  Avoid fast food or heavy meals as your are more likely to get nauseated.  Eat a low fat the next few days after surgery.   °2. Take your usually prescribed home medications unless otherwise directed. °3. PAIN CONTROL: °a. Pain is best controlled by a usual combination of three different methods TOGETHER: °i. Ice/Heat °ii. Over the counter pain medication °iii. Prescription pain medication °b. Most patients will experience some swelling and bruising around the incisions.  Ice packs or heating pads (30-60 minutes up to 6 times a day) will help. Use ice for the first few days to help decrease swelling and bruising, then switch to heat to help relax tight/sore spots and speed recovery.  Some people prefer to use ice alone, heat alone, alternating between ice & heat.  Experiment to what works for you.  Swelling and bruising can take several weeks to resolve.   °c. It is helpful to take an over-the-counter pain medication regularly for the first few weeks.  Choose one of the following that works best for you: °i. Naproxen (Aleve, etc)  Two 220mg tabs twice a day °ii. Ibuprofen (Advil, etc) Three 200mg tabs four times a day (every meal & bedtime) °iii. Acetaminophen (Tylenol, etc) 500-650mg four times a day (every meal & bedtime) °d. A  prescription for pain medication (such as oxycodone, hydrocodone, etc) should be given to you upon discharge.  Take your pain medication as prescribed.  °i. If you are having problems/concerns with the prescription medicine (does not control pain, nausea, vomiting, rash, itching, etc), please call us [phone] to see if we need to switch you to a different pain medicine that will work better for you and/or control your side effect better. °ii. If you need a refill on your pain medication,  please contact your pharmacy.  They will contact our office to request authorization. Prescriptions will not be filled after 5 pm or on week-ends. °4. Avoid getting constipated.  Between the surgery and the pain medications, it is common to experience some constipation.  Increasing fluid intake and taking a fiber supplement (such as Metamucil, Citrucel, FiberCon, MiraLax, etc) 1-2 times a day regularly will usually help prevent this problem from occurring.  A mild laxative (prune juice, Milk of Magnesia, MiraLax, etc) should be taken according to package directions if there are no bowel movements after 48 hours.   °5. Watch out for diarrhea.  If you have many loose bowel movements, simplify your diet to bland foods & liquids for a few days.  Stop any stool softeners and decrease your fiber supplement.  Switching to mild anti-diarrheal medications (Kayopectate, Pepto Bismol) can help.  If this worsens or does not improve, please call us. °6. Wash / shower every day.  You may shower over the dressings as they are waterproof.  Continue to shower over incision(s) after the dressing is off. °7. Remove your waterproof bandages 5 days after surgery.  You may leave the incision open to air.  You may replace a dressing/Band-Aid to cover the incision for comfort if you wish.  °8. ACTIVITIES as tolerated:   °a. You may resume regular (light) daily activities beginning the next day--such as daily self-care, walking, climbing stairs--gradually increasing activities as tolerated.  If you can walk 30 minutes without difficulty, it   is safe to try more intense activity such as jogging, treadmill, bicycling, low-impact aerobics, swimming, etc. b. Save the most intensive and strenuous activity for last such as sit-ups, heavy lifting, contact sports, etc  Refrain from any heavy lifting or straining until you are off narcotics for pain control.   c. DO NOT PUSH THROUGH PAIN.  Let pain be your guide: If it hurts to do something, don't  do it.  Pain is your body warning you to avoid that activity for another week until the pain goes down. d. You may drive when you are no longer taking prescription pain medication, you can comfortably wear a seatbelt, and you can safely maneuver your car and apply brakes. e. Dennis Bast may have sexual intercourse when it is comfortable.  9. FOLLOW UP in our office a. Please call [facility] at [phone] to set up an appointment to see your surgeon in the office for a follow-up appointment approximately 2-3 weeks after your surgery. b. Make sure that you call for this appointment the day you arrive home to insure a convenient appointment time. 10. IF YOU HAVE DISABILITY OR FAMILY LEAVE FORMS, BRING THEM TO THE OFFICE FOR PROCESSING.  DO NOT GIVE THEM TO YOUR DOCTOR.   WHEN TO CALL us [phone]: 1. Poor pain control 2. Reactions / problems with new medications (rash/itching, nausea, etc)  3. Fever over 101.5 F (38.5 C) 4. Inability to urinate 5. Nausea and/or vomiting 6. Worsening swelling or bruising 7. Continued bleeding from incision. 8. Increased pain, redness, or drainage from the incision   The clinic staff is available to answer your questions during regular business hours (8:30am-5pm).  Please dont hesitate to call and ask to speak to one of our nurses for clinical concerns.   If you have a medical emergency, go to the nearest emergency room or call 911.  A surgeon from [facility] is always on call at the [facility], [place], [place], [place], [place]  [address] ? MAIN: [phone] ? TOLL FREE: [phone] ?  FAX [phone] [email or web address]   Appendicitis Appendicitis is when the appendix is swollen (inflamed). The inflammation can lead to developing a hole (perforation) and a collection of pus (abscess). CAUSES  There is not always an obvious cause of appendicitis. Sometimes it is caused by an  obstruction in the appendix. The obstruction can be caused by:  A small, hard, pea-sized ball of stool (fecalith).  Enlarged lymph glands in the appendix. SYMPTOMS   Pain around your belly button (navel) that moves toward your lower right belly (abdomen). The pain can become more severe and sharp as time passes.  Tenderness in the lower right abdomen. Pain gets worse if you cough or make a sudden movement.  Feeling sick to your stomach (nauseous).  Throwing up (vomiting).  Loss of appetite.  Fever.  Constipation.  Diarrhea.  Generally not feeling well. DIAGNOSIS   Physical exam.  Blood tests.  Urine test.  X-rays or a CT scan may confirm the diagnosis. TREATMENT  Once the diagnosis of appendicitis is made, the most common treatment is to remove the appendix as soon as possible. This procedure is called appendectomy. In an open appendectomy, a cut (incision) is made in the lower right abdomen and the appendix is removed. In a laparoscopic appendectomy, usually 3 small incisions are made. Long, thin instruments and a camera tube are used to remove the appendix. Most patients go home  in 24 to 48 hours after appendectomy. In some situations, the appendix may have already perforated and an abscess may have formed. The abscess may have a "wall" around it as seen on a CT scan. In this case, a drain may be placed into the abscess to remove fluid, and you may be treated with antibiotic medicines that kill germs. The medicine is given through a tube in your vein (IV). Once the abscess has resolved, it may or may not be necessary to have an appendectomy. You may need to stay in the hospital longer than 48 hours. Document Released: 05/09/2005 Document Revised: 11/08/2011 Document Reviewed: 08/04/2009 [facility] Patient Information 2015 [employer] [place]. This information is not intended to replace advice given to you by your health care provider. Make sure you discuss any questions you have with  your health care provider.  GETTING TO GOOD BOWEL HEALTH. Irregular bowel habits such as constipation and diarrhea can lead to many problems over time.  Having one soft bowel movement a day is the most important way to prevent further problems.  The anorectal canal is designed to handle stretching and feces to safely manage our ability to get rid of solid waste (feces, poop, stool) out of our body.  BUT, hard constipated stools can act like ripping concrete bricks and diarrhea can be a burning fire to this very sensitive area of our body, causing inflamed hemorrhoids, anal fissures, increasing risk is perirectal abscesses, abdominal pain/bloating, an making irritable bowel worse.     The goal: ONE SOFT BOWEL MOVEMENT A DAY!  To have soft, regular bowel movements:    Drink at least 8 tall glasses of water a day.     Take plenty of fiber.  Fiber is the undigested part of plant food that passes into the colon, acting s natures broom to encourage bowel motility and movement.  Fiber can absorb and hold large amounts of water. This results in a larger, bulkier stool, which is soft and easier to pass. Work gradually over several weeks up to 6 servings a day of fiber (25g a day even more if needed) in the form of: o Vegetables -- Root (potatoes, carrots, turnips), leafy green (lettuce, salad greens, celery, spinach), or cooked high residue (cabbage, broccoli, etc) o Fruit -- Fresh (unpeeled skin & pulp), Dried (prunes, apricots, cherries, etc ),  or stewed ( applesauce)  o Whole grain breads, pasta, etc (whole wheat)  o Bran cereals    Bulking Agents -- This type of water-retaining fiber generally is easily obtained each day by one of the following:  o Psyllium bran -- The psyllium plant is remarkable because its ground seeds can retain so much water. This product is available as Metamucil, Konsyl, Effersyllium, Per Diem Fiber, or the less expensive generic preparation in drug and health food stores. Although  labeled a laxative, it really is not a laxative.  o Methylcellulose -- This is another fiber derived from wood which also retains water. It is available as Citrucel. o Polyethylene Glycol - and artificial fiber commonly called Miralax or Glycolax.  It is helpful for people with gassy or bloated feelings with regular fiber o Flax Seed - a less gassy fiber than psyllium   No reading or other relaxing activity while on the toilet. If bowel movements take longer than 5 minutes, you are too constipated   AVOID CONSTIPATION.  High fiber and water intake usually takes care of this.  Sometimes a laxative is needed to stimulate more frequent  bowel movements, but    Laxatives are not a good long-term solution as it can wear the colon out. o Osmotics (Milk of Magnesia, Fleets phosphosoda, Magnesium citrate, MiraLax, GoLytely) are safer than  o Stimulants (Senokot, Castor Oil, Dulcolax, Ex Lax)    o Do not take laxatives for more than 7days in a row.    IF SEVERELY CONSTIPATED, try a Bowel Retraining Program: o Do not use laxatives.  o Eat a diet high in roughage, such as bran cereals and leafy vegetables.  o Drink six (6) ounces of prune or apricot juice each morning.  o Eat two (2) large servings of stewed fruit each day.  o Take one (1) heaping tablespoon of a psyllium-based bulking agent twice a day. Use sugar-free sweetener when possible to avoid excessive calories.  o Eat a normal breakfast.  o Set aside 15 minutes after breakfast to sit on the toilet, but do not strain to have a bowel movement.  o If you do not have a bowel movement by the third day, use an enema and repeat the above steps.    Controlling diarrhea o Switch to liquids and simpler foods for a few days to avoid stressing your intestines further. o Avoid dairy products (especially milk & ice cream) for a short time.  The intestines often can lose the ability to digest lactose when stressed. o Avoid foods that cause gassiness or  bloating.  Typical foods include beans and other legumes, cabbage, broccoli, and dairy foods.  Every person has some sensitivity to other foods, so listen to our body and avoid those foods that trigger problems for you. o Adding fiber (Citrucel, Metamucil, psyllium, Miralax) gradually can help thicken stools by absorbing excess fluid and retrain the intestines to act more normally.  Slowly increase the dose over a few weeks.  Too much fiber too soon can backfire and cause cramping & bloating. o Probiotics (such as active yogurt, Align, etc) may help repopulate the intestines and colon with normal bacteria and calm down a sensitive digestive tract.  Most studies show it to be of mild help, though, and such products can be costly. o Medicines:   Bismuth subsalicylate (ex. Kayopectate, Pepto Bismol) every 30 minutes for up to 6 doses can help control diarrhea.  Avoid if pregnant.   Loperamide (Immodium) can slow down diarrhea.  Start with two tablets (4mg  total) first and then try one tablet every 6 hours.  Avoid if you are having fevers or severe pain.  If you are not better or start feeling worse, stop all medicines and call your doctor for advice o Call your doctor if you are getting worse or not better.  Sometimes further testing (cultures, endoscopy, X-ray studies, bloodwork, etc) may be needed to help diagnose and treat the cause of the diarrhea.  Managing Pain  Pain after surgery or related to activity is often due to strain/injury to muscle, tendon, nerves and/or incisions.  This pain is usually short-term and will improve in a few months.   Many people find it helpful to do the following things TOGETHER to help speed the process of healing and to get back to regular activity more quickly:  1. Avoid heavy physical activity a.  no lifting greater than 20 pounds b. Do not push through the pain.  Listen to your body and avoid positions and maneuvers than reproduce the pain c. Walking is okay as  tolerated, but go slowly and stop when getting sore.  d. Remember:  If it hurts to do it, then dont do it! 2. Take Anti-inflammatory medication  a. Take with food/snack around the clock for 1-2 weeks i. This helps the muscle and nerve tissues become less irritable and calm down faster b. Choose ONE of the following over-the-counter medications: i. Naproxen 220mg  tabs (ex. Aleve) 1-2 pills twice a day  ii. Ibuprofen 200mg  tabs (ex. Advil, Motrin) 3-4 pills with every meal and just before bedtime iii. Acetaminophen 500mg  tabs (Tylenol) 1-2 pills with every meal and just before bedtime 3. Use a Heating pad or Ice/Cold Pack a. 4-6 times a day b. May use warm bath/hottub  or showers 4. Try Gentle Massage and/or Stretching  a. at the area of pain many times a day b. stop if you feel pain - do not overdo it  Try these steps together to help you body heal faster and avoid making things get worse.  Doing just one of these things may not be enough.    If you are not getting better after two weeks or are noticing you are getting worse, contact our office for further advice; we may need to re-evaluate you & see what other things we can do to help.    Ice and elevate R knee as needed for swelling and pain Avoid squatting, pivoting, uneven ground, high impact activity Follow up with Dr. Shelle IronBeane in 2 weeks for recheck R knee

## 2014-03-12 NOTE — Progress Notes (Signed)
Name: Ernest Riley MRN: [id number] DOB: [birth year]    ADMISSION DATE:  03/05/2014 CONSULTATION DATE:  10/16  REFERRING MD :   Zola ButtonWill Jennings PA-C  CHIEF COMPLAINT:   Persistent hypoxia   BRIEF PATIENT DESCRIPTION:  53 y/o white male with h/o Depression and h/o umbilical hernia repair by Dr. Derrell LollingIngram on 07/18/2006 [facility] with right sided abdominal pain which radiates to the RLQ. The pain started at 3pm on 03/04/14 and has progressively worsened as the night went on. Around 1am he was brought to the ER due to 10/10 severe pain. Dx eval was consistent w/ appendicitis. He underwent laparoscopic appendectomy w/ post-op diagnosis of perforated appendix, appendicitis and peritonitis on 10/14. Post-op course has been notable for persistent hypoxia requiring supplemental oxygen via facemask in order to keep sats > 90%, this was worse at HS in the supine position so surgery team tried CPAP during the night of 10/15 w/ good symptomatic effect. PCCM has been asked to see on 10/16 as the pt continues to require oxygen via facemask in order to prevent desaturation.     SIGNIFICANT EVENTS  10/14: admitted w/ RLQ pain and + appendicitis  10/15: CXR w/ low volume.  O2 sats in 80s off facemask. Trial of CPAP at HS-->tolerated well.  10/16: PCCM asked to see for hypoxia  10/19 refuses bipap during the night 10/18 2 d with EF 65% mild LVH 10/20 refused nimvs 10/21 refused nimvs. Reports rt knee pain.  SUBJECTIVE/OVERNIGHT/INTERVAL HX 03/12/14 - did not use cpap last night - refused. Hypoxemia worse.   VITAL SIGNS: Temp:  [97.3 F (36.3 C)-98.4 F (36.9 C)] 97.3 F (36.3 C) (10/21 0400) Pulse Rate:  [61-83] 76 (10/21 0435) Resp:  [11-15] 14 (10/21 0435) BP: (140-160)/(81-99) 160/99 mmHg (10/21 0435) SpO2:  [87 %-94 %] 90 % (10/21 0435) Weight:  [239 lb 6.7 oz (108.6 kg)] 239 lb 6.7 oz (108.6 kg) (10/21 0400)  PHYSICAL EXAMINATION: General:  53 year old male, currently  on [place] for low sats. Sitting up in  Bed, NAD. Refused nimvs Neuro:  Awake, appropriate, no focal def, no motor def , intact HEENT:  [place], no JVD,  Cardiovascular: HSR RRR, brisk CR finger, skin warm  Lungs:  Decreased bs thru out Abdomen:  Distended. + faint BS,  . laproscopic trocar sites healing Musculoskeletal:  Intact , co rt knee swelling and pain. Mild swelling noted rt knee. Skin:  Intact     PULMONARY No results found for this basename: PHART, PCO2, PCO2ART, PO2, PO2ART, HCO3, TCO2, O2SAT,  in the last 168 hours  CBC  Recent Labs Lab 03/07/14 1429 03/09/14 1008 03/11/14 0346  HGB 11.9* 13.1 13.2  HCT 34.9* 38.1* 38.4*  WBC 11.4* 7.0 11.1*  PLT 217 246 267    COAGULATION No results found for this basename: INR,  in the last 168 hours  CARDIAC    Recent Labs Lab 03/07/14 1429 03/09/14 1008 03/09/14 1554 03/09/14 2114  TROPONINI <0.30 <0.30 <0.30 <0.30    Recent Labs Lab 03/09/14 0551 03/11/14 0346  PROBNP 168.2* 86.2     CHEMISTRY  Recent Labs Lab 03/07/14 1429 03/09/14 0551 03/09/14 1008 03/10/14 0005 03/10/14 0337 03/11/14 0346 03/12/14 0152  NA 139  --  135* 137  --  137 135*  K 4.2  --  3.2* 3.4*  --  3.3* 3.5*  CL 97  --  92* 93*  --  94* 95*  CO2 30  --  28 27  --  27 26  GLUCOSE 147*  --  100* 91  --  107* 101*  BUN 14  --  11 11  --  12 10  CREATININE 0.83  --  0.76 0.73  --  0.82 0.85  CALCIUM 9.1  --  9.1 9.0  --  9.2 8.8  MG  --  2.0 2.0  --  2.0 2.1 2.0  PHOS  --  3.1  --   --  2.9 3.2 3.3   Estimated Creatinine Clearance: 125.4 ml/min (by C-G formula based on Cr of 0.85).   LIVER  Recent Labs Lab 03/07/14 1429  AST 15  ALT 19  ALKPHOS 71  BILITOT 0.5  PROT 7.3  ALBUMIN 3.4*     INFECTIOUS No results found for this basename: LATICACIDVEN, PROCALCITON,  in the last 168 hours   ENDOCRINE CBG (last 3)   Recent Labs  03/10/14 0105  GLUCAP 88         IMAGING x48h Ct Angio Chest Pe W/cm &/or Wo  Cm  03/10/2014   CLINICAL DATA:  Acute respiratory failure, oxygen desaturation, acute shortness of breath  EXAM: CT ANGIOGRAPHY CHEST WITH CONTRAST  TECHNIQUE: Multidetector CT imaging of the chest was performed using the standard protocol during bolus administration of intravenous contrast. Multiplanar CT image reconstructions and MIPs were obtained to evaluate the vascular anatomy.  CONTRAST:  OMNIPAQUE IOHEXOL 350 MG/ML SOLN  COMPARISON:  None.  FINDINGS: Aorta normal caliber without aneurysm or dissection.  Heart appears mildly enlarged.  Atherosclerotic calcifications at coronary arteries.  Assessment of lower lobes degraded by motion artifacts and atelectasis.  Pulmonary arteries appear grossly patent.  No evidence of pulmonary embolism.  Scattered bibasilar atelectasis greater in RIGHT middle and RIGHT lower lobes.  Minimal increased attenuation centrally within the upper lobes could reflect minimal infection or edema.  No pleural effusion or pneumothorax.  No acute osseous findings.  Review of the MIP images confirms the above findings.  IMPRESSION: No evidence of pulmonary embolism.  Bibasilar atelectasis greater on RIGHT.  Question infiltrate centrally in upper lobes bilaterally, could reflect minimal edema or infection.   Electronically Signed   By: Ulyses Southward M.D.   On: 03/10/2014 14:19   Dg Chest Port 1 View  03/12/2014   CLINICAL DATA:  53 year old with persistent hypoxia. History of perforated appendicitis.  EXAM: PORTABLE CHEST - 1 VIEW  COMPARISON:  03/11/2014 and chest CT 02/1914  FINDINGS: There are persistent streaky densities at both lung bases. Findings are most compatible with subsegmental atelectasis. Overall, there are low lung volumes. Upper lungs remain clear. Heart size is grossly stable. The trachea is midline. Negative for a pneumothorax.  IMPRESSION: Persistent low lung volumes with bibasilar opacities. Findings are most compatible with atelectasis.   Electronically  Signed   By: Richarda Overlie M.D.   On: 03/12/2014 07:51   Dg Chest Port 1 View  03/11/2014   CLINICAL DATA:  Respiratory failure  EXAM: PORTABLE CHEST - 1 VIEW  COMPARISON:  CTA chest dated 03/10/2014  FINDINGS: Low lung volumes. Mild patchy bibasilar opacities, right greater than left, likely atelectasis. Suspected small left pleural effusion. No pneumothorax.  Heart is top-normal in size for inspiration.  IMPRESSION: Low lung volumes.  Patchy bilateral lower lobe opacities, likely atelectasis.  Suspected small left pleural effusion.   Electronically Signed   By: Charline Bills M.D.   On: 03/11/2014 07:55   Patient Active Problem List   Diagnosis  Date Noted  . Atrial fibrillation 03/09/2014  . Acute respiratory failure with hypoxia 03/08/2014  . OSA (obstructive sleep apnea) 03/06/2014  . Suppurative appendicitis 03/06/2014  . Acute fulminating appendicitis with perforation and peritonitis s/p lap appendectomy 03/05/2014 03/05/2014  . Obesity (BMI 30-39.9) 03/05/2014  . Meckel diverticulum - asymptomatic 03/05/2014     ASSESSMENT / PLAN:  Acute Resp failure Hypoxia due to atelectasis in setting of decreased abd compliance (mix of obesity and abd pain/distention after laparoscopic surgery).  Probable OSA -h/o loud snoring & occ witnessed apneas Doubt PNA LEDS neg dvt 10/19 CTA neg PE  - remains O2 dependent  Rec: Push pulmonary hygiene measures: IS and flutter; again encourage Ambulate Wean FIO2 as able QHS auto-set CPAP (refuses) therefore stop ordering cpap 10/21  - . May need sleep eval  f/u after d/c, if he would consent.    A FIb  - resolved in a few minutes  - trop x 1 negative  PLAN - get echo see report - ensure K  > 4 K+ ordered 03/12/14   PAIN A/P   - management by [facility]   OK to go to floor. Wean O2 as tolerated. PCCM will follow every 1-3 days. Might need dc with o2   Brett CanalesSteve Minor ACNP [facility] Pager [phone] till 3 pm If no answer page  [phone] 03/12/2014, 9:35 AM     STAFF MD  - he was sleeping when I went to see him. HE will not wear cpap/biap at night. So we will expect his hypoxemia to be slow to resolve. His BNP isnormal; so no more lasix. Recommend time, patience and continued pulmonary hygiene and PT . PCCM will see every few days   Dr. Kalman ShanMurali Lavante Toso, M.D., [facility].C.C.P Pulmonary and Critical Care Medicine Staff Physician [facility] Pager: [phone], If no answer or between  15:00h - 7:00h: call [phone]  03/12/2014 10:35 AM

## 2014-03-13 LAB — BASIC METABOLIC PANEL
ANION GAP: 13 (ref 5–15)
BUN: 10 mg/dL (ref 6–23)
CALCIUM: 9.1 mg/dL (ref 8.4–10.5)
CO2: 23 mEq/L (ref 19–32)
CREATININE: 0.83 mg/dL (ref 0.50–1.35)
Chloride: 96 mEq/L (ref 96–112)
GFR calc non Af Amer: >90 mL/min (ref >90)
Glucose, Bld: 128 mg/dL — ABNORMAL HIGH (ref 70–99)
Potassium: 4.1 mEq/L (ref 3.7–5.3)
Sodium: 132 mEq/L — ABNORMAL LOW (ref 137–147)

## 2014-03-13 LAB — MAGNESIUM: Magnesium: 2.2 mg/dL (ref 1.5–2.5)

## 2014-03-13 LAB — CBC
HCT: 38.4 % — ABNORMAL LOW (ref 39.0–52.0)
Hemoglobin: 13.6 g/dL (ref 13.0–17.0)
MCH: 31.1 pg (ref 26.0–34.0)
MCHC: 35.4 g/dL (ref 30.0–36.0)
MCV: 87.7 fL (ref 78.0–100.0)
Platelets: 299 10*3/uL (ref 150–400)
RBC: 4.38 MIL/uL (ref 4.22–5.81)
RDW: 12.1 % (ref 11.5–15.5)
WBC: 10.7 10*3/uL — AB (ref 4.0–10.5)

## 2014-03-13 LAB — PHOSPHORUS: Phosphorus: 3.1 mg/dL (ref 2.3–4.6)

## 2014-03-13 MED ORDER — OXYCODONE HCL 5 MG PO TABS: 5.0000 mg | ORAL_TABLET | Freq: Four times a day (QID) | ORAL | Status: AC | PRN

## 2014-03-13 MED ORDER — ACETAMINOPHEN 500 MG PO TABS: 1000.0000 mg | ORAL_TABLET | Freq: Three times a day (TID) | ORAL | Status: AC

## 2014-03-13 NOTE — Progress Notes (Signed)
Patient interviewed and examined, agree with NP note above.  Mariella SaaBenjamin T Taylin Leder MD, FACS  03/13/2014 3:32 PM

## 2014-03-13 NOTE — Progress Notes (Signed)
Patient ID: Ernest Riley, male   DOB: 1961/01/07, 53 y.o.   MRN: [id number]     [facility]      [address] [place], [place] [address]    Phone: [phone] FAX: [phone]     Subjective: Feels great.  VSS.  Afebrile.  On room air, no desaturation noted.  Voiding, having BMs, ambulating and pain is well controlled.   Objective:  Vital signs:  Filed Vitals:   03/12/14 1250 03/12/14 1509 03/12/14 2056 03/13/14 0534  BP: 141/74  150/78 150/87  Pulse: 80  72 77  Temp: 98.6 F (37 C)  98.3 F (36.8 C) 98.5 F (36.9 C)  TempSrc: Oral  Oral Oral  Resp: _0 Height:      Weight:      SpO2: 98% 96% 93% 95%    Last BM Date: 03/11/14  Intake/Output   Yesterday:  10/21 0701 - 10/22 0700 In: -  Out: 650 [Urine:650] This shift:  Total I/O In: -  Out: 350 [Urine:350]   Physical Exam:  General: Pt awake/alert/oriented x4 in no acute distress  Chest: cta. No chest wall pain w good excursion  CV: Pulses intact. Regular rhythm  Abdomen: Soft. Nondistended. Mild ttp LLQ. Incisions are c/d/i. No evidence of peritonitis. No incarcerated hernias.  Ext: Moderate swelling right knee, ttp medial aspect of the knee, normal ROM, pain with passive ROM  Skin: No petechiae / purpura  Problem List:   Principal Problem:   Acute fulminating appendicitis with perforation and peritonitis s/p lap appendectomy 03/05/2014 Active Problems:   Obesity (BMI 30-39.9)   Meckel diverticulum - asymptomatic   OSA (obstructive sleep apnea)   Suppurative appendicitis   Acute respiratory failure with hypoxia   Atrial fibrillation    Results:   Labs: Results for orders placed during the hospital encounter of 03/05/14 (from the past 48 hour(s))  MAGNESIUM     Status: None   Collection Time    03/12/14  1:52 AM      Result Value Ref Range   Magnesium 2.0  1.5 - 2.5 mg/dL  PHOSPHORUS     Status: None   Collection Time    03/12/14   1:52 AM      Result Value Ref Range   Phosphorus 3.3  2.3 - 4.6 mg/dL  BASIC METABOLIC PANEL     Status: Abnormal   Collection Time    03/12/14  1:52 AM      Result Value Ref Range   Sodium 135 (*) 137 - 147 mEq/L   Potassium 3.5 (*) 3.7 - 5.3 mEq/L   Chloride 95 (*) 96 - 112 mEq/L   CO2 26  19 - 32 mEq/L   Glucose, Bld 101 (*) 70 - 99 mg/dL   BUN 10  6 - 23 mg/dL   Creatinine, Ser 0.85  0.50 - 1.35 mg/dL   Calcium 8.8  8.4 - 10.5 mg/dL   GFR calc non Af Amer >90  >90 mL/min   GFR calc Af Amer >90  >90 mL/min   Comment: (NOTE)     The eGFR has been calculated using the CKD EPI equation.     This calculation has not been validated in all clinical situations.     eGFR's persistently <90 mL/min signify possible Chronic Kidney     Disease.   Anion gap 14  5 - 15  URIC ACID     Status: Abnormal  Collection Time    03/12/14  2:05 PM      Result Value Ref Range   Uric Acid, Serum 11.1 (*) 4.0 - 7.8 mg/dL  SEDIMENTATION RATE     Status: Abnormal   Collection Time    03/12/14  2:05 PM      Result Value Ref Range   Sed Rate 40 (*) 0 - 16 mm/hr  CBC     Status: Abnormal   Collection Time    03/12/14  2:05 PM      Result Value Ref Range   WBC 11.1 (*) 4.0 - 10.5 K/uL   RBC 4.34  4.22 - 5.81 MIL/uL   Hemoglobin 13.6  13.0 - 17.0 g/dL   HCT 38.8 (*) 39.0 - 52.0 %   MCV 89.4  78.0 - 100.0 fL   MCH 31.3  26.0 - 34.0 pg   MCHC 35.1  30.0 - 36.0 g/dL   RDW 12.3  11.5 - 15.5 %   Platelets 251  150 - 400 K/uL  PHOSPHORUS     Status: None   Collection Time    03/13/14  4:53 AM      Result Value Ref Range   Phosphorus 3.1  2.3 - 4.6 mg/dL  MAGNESIUM     Status: None   Collection Time    03/13/14  4:53 AM      Result Value Ref Range   Magnesium 2.2  1.5 - 2.5 mg/dL  CBC     Status: Abnormal   Collection Time    03/13/14  4:53 AM      Result Value Ref Range   WBC 10.7 (*) 4.0 - 10.5 K/uL   RBC 4.38  4.22 - 5.81 MIL/uL   Hemoglobin 13.6  13.0 - 17.0 g/dL   HCT 38.4 (*) 39.0 -  52.0 %   MCV 87.7  78.0 - 100.0 fL   MCH 31.1  26.0 - 34.0 pg   MCHC 35.4  30.0 - 36.0 g/dL   RDW 12.1  11.5 - 15.5 %   Platelets 299  150 - 400 K/uL  BASIC METABOLIC PANEL     Status: Abnormal   Collection Time    03/13/14  4:53 AM      Result Value Ref Range   Sodium 132 (*) 137 - 147 mEq/L   Potassium 4.1  3.7 - 5.3 mEq/L   Chloride 96  96 - 112 mEq/L   CO2 23  19 - 32 mEq/L   Glucose, Bld 128 (*) 70 - 99 mg/dL   BUN 10  6 - 23 mg/dL   Creatinine, Ser 0.83  0.50 - 1.35 mg/dL   Calcium 9.1  8.4 - 10.5 mg/dL   GFR calc non Af Amer >90  >90 mL/min   GFR calc Af Amer >90  >90 mL/min   Comment: (NOTE)     The eGFR has been calculated using the CKD EPI equation.     This calculation has not been validated in all clinical situations.     eGFR's persistently <90 mL/min signify possible Chronic Kidney     Disease.   Anion gap 13  5 - 15    Imaging / Studies: Dg Knee 1-2 Views Right  03/12/2014   CLINICAL DATA:  Acute right anterior and medial knee pain and swelling.  EXAM: RIGHT KNEE - 1-2 VIEW  COMPARISON:  None.  FINDINGS: There is no joint effusion identified. Patellofemoral joint space narrowing is noted. There is mild patellofemoral  marginal spur formation in sharp in the tibial spines. Small lucency along the undersurface of the proximal pole of the patella measures 4 mm. No fractures or dislocations identified.  IMPRESSION: 1. Osteoarthritis, this predominantly involves the patellofemoral compartment. 2. Cannot rule out small osteochondral defect involving the patella   Electronically Signed   By: Kerby Moors M.D.   On: 03/12/2014 11:26   Dg Chest Port 1 View  03/12/2014   CLINICAL DATA:  53 year old with persistent hypoxia. History of perforated appendicitis.  EXAM: PORTABLE CHEST - 1 VIEW  COMPARISON:  03/11/2014 and chest CT 02/1914  FINDINGS: There are persistent streaky densities at both lung bases. Findings are most compatible with subsegmental atelectasis. Overall, there  are low lung volumes. Upper lungs remain clear. Heart size is grossly stable. The trachea is midline. Negative for a pneumothorax.  IMPRESSION: Persistent low lung volumes with bibasilar opacities. Findings are most compatible with atelectasis.   Electronically Signed   By: Markus Daft M.D.   On: 03/12/2014 07:51    Medications / Allergies:  Scheduled Meds: . acetaminophen  1,000 mg Oral TID  . antiseptic oral rinse  7 mL Mouth Rinse q12n4p  . cefTRIAXone (ROCEPHIN)  IV  2 g Intravenous Q24H  . chlorhexidine  15 mL Mouth Rinse BID  . citalopram  20 mg Oral Daily  . enoxaparin (LOVENOX) injection  40 mg Subcutaneous Q24H  . fluticasone  2 spray Each Nare Daily  . lip balm  1 application Topical BID  . metronidazole  500 mg Intravenous Q6H  . naproxen  500 mg Oral BID WC  . saccharomyces boulardii  250 mg Oral BID  . sodium chloride  2 spray Each Nare BID  . sodium chloride  3 mL Intravenous Q12H   Continuous Infusions:  PRN Meds:.alum & mag hydroxide-simeth, bisacodyl, HYDROmorphone (DILAUDID) injection, magic mouthwash, menthol-cetylpyridinium, metoprolol, ondansetron, oxyCODONE, phenol, promethazine, sodium chloride  Antibiotics: Anti-infectives   Start     Dose/Rate Route Frequency Ordered Stop   03/05/14 1700  metroNIDAZOLE (FLAGYL) IVPB 500 mg     500 mg 100 mL/hr over 60 Minutes Intravenous Every 6 hours 03/05/14 1533     03/05/14 1600  piperacillin-tazobactam (ZOSYN) IVPB 3.375 g     3.375 g 12.5 mL/hr over 240 Minutes Intravenous  Once 03/05/14 1533 03/05/14 2311   03/05/14 1600  cefTRIAXone (ROCEPHIN) 2 g in dextrose 5 % 50 mL IVPB    Comments:  Pharmacy may adjust dosing strength / duration / interval for maximal efficacy   2 g 100 mL/hr over 30 Minutes Intravenous Every 24 hours 03/05/14 1533     03/05/14 0815  piperacillin-tazobactam (ZOSYN) IVPB 3.375 g  Status:  Discontinued     3.375 g 12.5 mL/hr over 240 Minutes Intravenous 4 times per day 03/05/14 0803 03/05/14  0803   03/05/14 0630  [MAR Hold]  piperacillin-tazobactam (ZOSYN) IVPB 3.375 g     (On MAR Hold since 03/05/14 0836)   3.375 g 12.5 mL/hr over 240 Minutes Intravenous  Once 03/05/14 0620 03/05/14 1054   03/05/14 0000  amoxicillin-clavulanate (AUGMENTIN) 875-125 MG per tablet     1 tablet Oral 2 times daily 03/05/14 1122         Assessment/Plan  perforated appendicitis with suppuration  POD#8 laparoscopic appendectomy---Dr. Johney Maine 03/05/14  -advance to soft diet -IS(1713m)  -mobilize  -SCD/lovenox  -D#8 rocephin/flagyl. DC today. Hypoxia  Questionable OSA-refusing CPAP, will need a sleep study on outpatient basis. He is followed by Dr. OMaxwell Caul -  continuous pulse ox at night, not done last night. -he's on room air since yesterday evening sats >93% -pulmonary to re-evaluate, if stable from pulmonary perspective will discharge home Right knee swelling, likely OA, no trauma, no hx of gout  -s/p kenalog injection, RICE, naproxen, f/u Dr. Tonita Cong 2 weeks Hypokalemia  -resolved Depression-home meds  dispo-discharge home if okay with pulmonary   Erby Pian, ANP-BC [facility] Pager [phone](7A-4:30P)   03/13/2014 8:33 AM

## 2014-03-13 NOTE — Progress Notes (Signed)
Name: Ernest Riley MRN: [id number] DOB: 1960-12-23    ADMISSION DATE:  03/05/2014 CONSULTATION DATE:  10/16  REFERRING MD :   Zola Button PA-C  CHIEF COMPLAINT:   Persistent hypoxia   BRIEF PATIENT DESCRIPTION:  53 y/o white male with h/o Depression and h/o umbilical hernia repair by Dr. [name] on 07/18/2006 who presented to [facility] on 10/14 with right sided abdominal pain which radiates to the RLQ. The pain started at 3pm on 03/04/14 and has progressively worsened as the night went on. Around 1am he was brought to the ER due to 10/10 severe pain. Dx eval was consistent w/ appendicitis. He underwent laparoscopic appendectomy w/ post-op diagnosis of perforated appendix, appendicitis and peritonitis on 10/14. Post-op course has been notable for persistent hypoxia requiring supplemental oxygen via facemask in order to keep sats > 90%, this was worse at HS in the supine position so surgery team tried CPAP during the night of 10/15 w/ good symptomatic effect. PCCM has been asked to see on 10/16 as the pt continues to require oxygen via facemask in order to prevent desaturation.     SIGNIFICANT EVENTS  10/14: admitted w/ RLQ pain and + appendicitis  10/15: CXR w/ low volume.  O2 sats in 80s off facemask. Trial of CPAP at HS-->tolerated well.  10/16: PCCM asked to see for hypoxia  10/19 refuses bipap during the night 10/18 2 d with EF 65% mild LVH 10/20 refused nimvs 10/21 refused nimvs. Reports rt knee pain. 03/12/14 - did not use cpap last night - refused. Hypoxemia worse.   SUBJECTIVE/OVERNIGHT/INTERVAL HX 03/13/14 - remarkably better. Hypoxemia resolved. O2 stayed up at 93% after walking length of 5W hallway x 2. Feels great. Wants to go home. Feels lasix turned the corner for him.   VITAL SIGNS: Temp:  [98.3 F (36.8 C)-98.6 F (37 C)] 98.5 F (36.9 C) (10/22 0534) Pulse Rate:  [72-80] 77 (10/22 0534) Resp:  [14-18] 16 (10/22 0534) BP: (141-157)/(74-95) 150/87 mmHg (10/22  0534) SpO2:  [92 %-98 %] 95 % (10/22 0534)  PHYSICAL EXAMINATION: General:  53 year old male, looks much better . Doing work on laptop Neuro:  Awake, appropriate, no focal def, no motor def , intact HEENT:  [place], no JVD,  Cardiovascular: HSR RRR, brisk CR finger, skin warm  Lungs:  Some basak crackles Abdomen:  Distended. + faint BS,  . laproscopic trocar sites healing Musculoskeletal:  Intact , co rt knee swelling and pain. Mild swelling noted rt knee. Skin:  Intact     PULMONARY No results found for this basename: PHART, PCO2, PCO2ART, PO2, PO2ART, HCO3, TCO2, O2SAT,  in the last 168 hours  CBC  Recent Labs Lab 03/11/14 0346 03/12/14 1405 03/13/14 0453  HGB 13.2 13.6 13.6  HCT 38.4* 38.8* 38.4*  WBC 11.1* 11.1* 10.7*  PLT 267 251 299    COAGULATION No results found for this basename: INR,  in the last 168 hours  CARDIAC    Recent Labs Lab 03/07/14 1429 03/09/14 1008 03/09/14 1554 03/09/14 2114  TROPONINI <0.30 <0.30 <0.30 <0.30    Recent Labs Lab 03/09/14 0551 03/11/14 0346  PROBNP 168.2* 86.2     CHEMISTRY  Recent Labs Lab 03/09/14 0551 03/09/14 1008 03/10/14 0005 03/10/14 0337 03/11/14 0346 03/12/14 0152 03/13/14 0453  NA  --  135* 137  --  137 135* 132*  K  --  3.2* 3.4*  --  3.3* 3.5* 4.1  CL  --  92* 93*  --  94* 95* 96  CO2  --  28 27  --  27 26 23   GLUCOSE  --  100* 91  --  107* 101* 128*  BUN  --  11 11  --  12 10 10   CREATININE  --  0.76 0.73  --  0.82 0.85 0.83  CALCIUM  --  9.1 9.0  --  9.2 8.8 9.1  MG 2.0 2.0  --  2.0 2.1 2.0 2.2  PHOS 3.1  --   --  2.9 3.2 3.3 3.1   Estimated Creatinine Clearance: 128.4 ml/min (by C-G formula based on Cr of 0.83).   LIVER  Recent Labs Lab 03/07/14 1429  AST 15  ALT 19  ALKPHOS 71  BILITOT 0.5  PROT 7.3  ALBUMIN 3.4*     INFECTIOUS No results found for this basename: LATICACIDVEN, PROCALCITON,  in the last 168 hours   ENDOCRINE CBG (last 3)  No results found for this  basename: GLUCAP,  in the last 72 hours       IMAGING x48h Dg Knee 1-2 Views Right  03/12/2014   CLINICAL DATA:  Acute right anterior and medial knee pain and swelling.  EXAM: RIGHT KNEE - 1-2 VIEW  COMPARISON:  None.  FINDINGS: There is no joint effusion identified. Patellofemoral joint space narrowing is noted. There is mild patellofemoral marginal spur formation in sharp in the tibial spines. Small lucency along the undersurface of the proximal pole of the patella measures 4 mm. No fractures or dislocations identified.  IMPRESSION: 1. Osteoarthritis, this predominantly involves the patellofemoral compartment. 2. Cannot rule out small osteochondral defect involving the patella   Electronically Signed   By: Signa Kellaylor  Stroud M.D.   On: 03/12/2014 11:26   Dg Chest Port 1 View  03/12/2014   CLINICAL DATA:  53 year old with persistent hypoxia. History of perforated appendicitis.  EXAM: PORTABLE CHEST - 1 VIEW  COMPARISON:  03/11/2014 and chest CT 02/1914  FINDINGS: There are persistent streaky densities at both lung bases. Findings are most compatible with subsegmental atelectasis. Overall, there are low lung volumes. Upper lungs remain clear. Heart size is grossly stable. The trachea is midline. Negative for a pneumothorax.  IMPRESSION: Persistent low lung volumes with bibasilar opacities. Findings are most compatible with atelectasis.   Electronically Signed   By: Richarda OverlieAdam  Henn M.D.   On: 03/12/2014 07:51   Patient Active Problem List   Diagnosis Date Noted  . Atrial fibrillation 03/09/2014  . Acute respiratory failure with hypoxia 03/08/2014  . OSA (obstructive sleep apnea) 03/06/2014  . Suppurative appendicitis 03/06/2014  . Acute fulminating appendicitis with perforation and peritonitis s/p lap appendectomy 03/05/2014 03/05/2014  . Obesity (BMI 30-39.9) 03/05/2014  . Meckel diverticulum - asymptomatic 03/05/2014     ASSESSMENT / PLAN:  Acute Resp failure Hypoxia due to atelectasis in  setting of decreased abd compliance (mix of obesity and abd pain/distention after laparoscopic surgery).  Probable OSA -h/o loud snoring & occ witnessed apneas Doubt PNA LEDS neg dvt 10/19 CTA neg PE   - off o2. No desats with walking  Rec: DC home No need for home o2 even at night as of now OPD sleep eval by Dr Earl Galasborne   A FIb  - resolved in a few minutes  - trop x 1 negative  PLAN -PCP No PCP Per Patient to consider cards consult dependng on coruse    [facility]. Will sign off   Dr. Kalman ShanMurali Lyndi Holbein, M.D., [facility].C.C.P Pulmonary  and Critical Care Medicine Staff Physician [facility] Pager: [phone], If no answer or between  15:00h - 7:00h: call [phone]  03/13/2014 10:42 AM

## 2014-03-13 NOTE — Progress Notes (Signed)
Patient ambulated 150' in the hall off O2. His sats held steady at 93%, heartrate to 106. No adverse events

## 2014-03-13 NOTE — Discharge Summary (Signed)
Physician Discharge Summary  Ernest Riley:[id number] DOB: 07/19/1960 DOA: 03/05/2014  PCP: No PCP Per Patient  Consultation: CCM  Admit date: 03/05/2014 Discharge date: 03/13/2014  Recommendations for Outpatient Follow-up:   Follow-up Information   Follow up with [facility] On 05/08/2014. (arrive by 1PM for a 1:30PM post op check.To follow up after your operation, To follow up after your hospital stay)    Contact information:   [address]   [place] [address] [phone]       Follow up with Ernest Docker, MD In 2 weeks. (for recheck of knee)    Specialty:  Orthopedic Surgery   Contact information:   [address] [place] [address] [phone]      Discharge Diagnoses:  1. Perforated appendicitis with suppuration 2. Hypoxia 3. Right knee swelling 4. Hypokalemia 5. Depression   Surgical Procedure: laparoscopic appendectomy---Dr. [name] 03/05/14    Discharge Condition: stable Disposition: home  Diet recommendation: regular  Filed Weights   03/09/14 0400 03/10/14 0400 03/12/14 0400  Weight: 244 lb 11.4 oz (111 kg) 260 lb 2.3 oz (118 kg) 239 lb 6.7 oz (108.6 kg)     Filed Vitals:   03/13/14 0534  BP: 150/87  Pulse: 77  Temp: 98.5 F (36.9 C)  Resp: 16     Hospital Course:  53 y/o white male with h/o Depression and h/o umbilical hernia repair by Dr. Derrell Riley on 07/18/2006 who presented to [facility] with right sided abdominal pain which radiates to the RLQ. CT of abdomen and pelvis showed suppurative appendicitis.  He underwent a laparoscopic appendectomy.  He tolerated the procedure well and was transferred  He then developed hypoxia.  Volume overload versus OSA, he was given lasix, pulmonary was consulted who recommended CPAP, but refused.  Echo and CT of chest were negative.  He also went into AF and spontaneously converted to NSR.  His hypoxia resolved and he was sating normally on room air.  He was  advised to follow up with his PCP for a sleep study.  I reiterated the importance of this.  He also complained of right knee swelling, XR showed OA.  Orthopedics was consulted who injected the joint with kenalog.  He felt much better.  He will follow up with Dr. Shelle Riley in 2 weeks. On POD#8 the patient was tolerating a diet, having BMs, pain well controlled, ambulating and therefore felt stable for discharge.  Medication risks, benefits and therapeutic alternatives were reviewed with the patient.  He verbalizes understanding. A follow up was made on his behalf he was encouraged to call with questions or concerns.      Discharge Instructions  Discharge Instructions   Call MD for:  extreme fatigue    Complete by:  As directed      Call MD for:  hives    Complete by:  As directed      Call MD for:  persistant nausea and vomiting    Complete by:  As directed      Call MD for:  redness, tenderness, or signs of infection (pain, swelling, redness, odor or green/yellow discharge around incision site)    Complete by:  As directed      Call MD for:  severe uncontrolled pain    Complete by:  As directed      Call MD for:    Complete by:  As directed   Temperature > 101.73F     Diet -  low sodium heart healthy    Complete by:  As directed      Discharge instructions    Complete by:  As directed   Please see discharge instruction sheets.  Also refer to handout given an office.  Please call our office if you have any questions or concerns [phone]     Discharge wound care:    Complete by:  As directed   If you have closed incisions, shower and bathe over these incisions with soap and water every day.  Remove all surgical dressings on postoperative day #3.  You do not need to replace dressings over the closed incisions unless you feel more comfortable with a Band-Aid covering it.   If you have an open wound that requires packing, please see wound care instructions.  In general, remove all dressings,  wash wound with soap and water and then replace with saline moistened gauze.  Do the dressing change at least every day.  Please call our office [phone] if you have further questions.     Driving Restrictions    Complete by:  As directed   No driving until off narcotics and can safely swerve away without pain during an emergency     Increase activity slowly    Complete by:  As directed   Walk an hour a day.  Use 20-30 minute walks.  When you can walk 30 minutes without difficulty, increase to low impact/moderate activities such as biking, jogging, swimming, sexual activity..  Eventually can increase to unrestricted activity when not feeling pain.  If you feel pain: STOP!Marland Kitchen.   Let pain protect you from overdoing it.  Use ice/heat/over-the-counter pain medications to help minimize his soreness.  Use pain prescriptions as needed to remain active.  It is better to take extra pain medications and be more active than to stay bedridden to avoid all pain medications.     Lifting restrictions    Complete by:  As directed   Avoid heavy lifting initially.  Do not push through pain.  You have no specific weight limit.  Coughing and sneezing or four more stressful to your incision than any lifting you will do. Pain will protect you from injury.  Therefore, avoid intense activity until off all narcotic pain medications.  Coughing and sneezing or four more stressful to your incision than any lifting he will do.     May shower / Bathe    Complete by:  As directed      May walk up steps    Complete by:  As directed      Sexual Activity Restrictions    Complete by:  As directed   Sexual activity as tolerated.  Do not push through pain.  Pain will protect you from injury.     Walk with assistance    Complete by:  As directed   Walk over an hour a day.  May use a walker/cane/companion to help with balance and stamina.            Medication List         acetaminophen 500 MG tablet  Commonly known as:   TYLENOL  Take 2 tablets (1,000 mg total) by mouth 3 (three) times daily.     ADVIL PM 200-25 MG Caps  Generic drug:  Ibuprofen-Diphenhydramine HCl  Take 3 tablets by mouth at bedtime as needed (sleep).     citalopram 20 MG tablet  Commonly known as:  CELEXA  Take 1 tablet by mouth  daily.     ibuprofen 200 MG tablet  Commonly known as:  ADVIL,MOTRIN  Take 800 mg by mouth every 6 (six) hours as needed for moderate pain.     oxyCODONE 5 MG immediate release tablet  Commonly known as:  Oxy IR/ROXICODONE  Take 1-2 tablets (5-10 mg total) by mouth every 6 (six) hours as needed for moderate pain, severe pain or breakthrough pain.           Follow-up Information   Follow up with [facility] On 05/08/2014. (arrive by 1PM for a 1:30PM post op check.To follow up after your operation, To follow up after your hospital stay)    Contact information:   [address]   [place] [address] [phone]       Follow up with Ernest DockerBEANE,JEFFREY C, MD In 2 weeks. (for recheck of knee)    Specialty:  Orthopedic Surgery   Contact information:   [address] [place] [id number] [phone]        The results of significant diagnostics from this hospitalization (including imaging, microbiology, ancillary and laboratory) are listed below for reference.    Significant Diagnostic Studies: Dg Knee 1-2 Views Right  03/12/2014   CLINICAL DATA:  Acute right anterior and medial knee pain and swelling.  EXAM: RIGHT KNEE - 1-2 VIEW  COMPARISON:  None.  FINDINGS: There is no joint effusion identified. Patellofemoral joint space narrowing is noted. There is mild patellofemoral marginal spur formation in sharp in the tibial spines. Small lucency along the undersurface of the proximal pole of the patella measures 4 mm. No fractures or dislocations identified.  IMPRESSION: 1. Osteoarthritis, this predominantly involves the patellofemoral compartment. 2. Cannot rule  out small osteochondral defect involving the patella   Electronically Signed   By: Signa Kellaylor  Stroud M.D.   On: 03/12/2014 11:26   Dg Abd 1 View  03/07/2014   CLINICAL DATA:  Laparoscopic appendectomy on 03/06/2014. Ileus following gastrointestinal surgery.  EXAM: ABDOMEN - 1 VIEW  COMPARISON:  CT abdomen and pelvis 03/05/2014  FINDINGS: Residual oral contrast material is present in the colon. Gas is present throughout the colon, which is nondilated. There is mild gaseous distention of small bowel loops in the right lower quadrant measuring up to 3 cm in diameter. Evaluation for intraperitoneal free air is limited by supine technique, however there is evidence of free air bilaterally in the mid and lower abdomen related to recent surgery. Degenerative changes are noted in the lower lumbar spine.  IMPRESSION: Mild gaseous distention of small bowel loops in the lower abdomen, which may reflect mild postoperative ileus.   Electronically Signed   By: Sebastian AcheAllen  Grady   On: 03/07/2014 14:26   Ct Angio Chest Pe W/cm &/or Wo Cm  03/10/2014   CLINICAL DATA:  Acute respiratory failure, oxygen desaturation, acute shortness of breath  EXAM: CT ANGIOGRAPHY CHEST WITH CONTRAST  TECHNIQUE: Multidetector CT imaging of the chest was performed using the standard protocol during bolus administration of intravenous contrast. Multiplanar CT image reconstructions and MIPs were obtained to evaluate the vascular anatomy.  CONTRAST:  100mL OMNIPAQUE IOHEXOL 350 MG/ML SOLN  COMPARISON:  None.  FINDINGS: Aorta normal caliber without aneurysm or dissection.  Heart appears mildly enlarged.  Atherosclerotic calcifications at coronary arteries.  Assessment of lower lobes degraded by motion artifacts and atelectasis.  Pulmonary arteries appear grossly patent.  No evidence of pulmonary embolism.  Scattered bibasilar atelectasis greater in RIGHT  middle and RIGHT lower lobes.  Minimal increased attenuation centrally within the upper lobes could  reflect minimal infection or edema.  No pleural effusion or pneumothorax.  No acute osseous findings.  Review of the MIP images confirms the above findings.  IMPRESSION: No evidence of pulmonary embolism.  Bibasilar atelectasis greater on RIGHT.  Question infiltrate centrally in upper lobes bilaterally, could reflect minimal edema or infection.   Electronically Signed   By: Ulyses Southward M.D.   On: 03/10/2014 14:19   Ct Abdomen Pelvis W Contrast  03/05/2014   CLINICAL DATA:  Right lower quadrant pain.  Initial encounter.  EXAM: CT ABDOMEN AND PELVIS WITH CONTRAST  TECHNIQUE: Multidetector CT imaging of the abdomen and pelvis was performed using the standard protocol following bolus administration of intravenous contrast.  CONTRAST:  OMNIPAQUE IOHEXOL 300 MG/ML  SOLN  COMPARISON:  None.  FINDINGS: BODY WALL: Unremarkable.  LOWER CHEST: Bibasilar atelectasis.  ABDOMEN/PELVIS:  Liver: Suspect diffuse, mild fatty infiltration of the liver. No focal lesion.  Biliary: No evidence of biliary obstruction or stone.  Pancreas: Unremarkable.  Spleen: 1 cm cyst or other benign appearing process in the inferior spleen.  Adrenals: Unremarkable.  Kidneys and ureters: No hydronephrosis or stone.  Bladder: Unremarkable.  Reproductive: Unremarkable.  Bowel: Thickened and avidly enhancing appendix with extensive mesoappendix inflammation and localized peritoneal fluid. The midportion of the inferior wall is indistinct when viewed coronally. There is no abscess or gross perforation. Mild reactive ileus involving the distal ileum. No small bowel obstruction.  Retroperitoneum: No mass or adenopathy.  Peritoneum: No free ascites or pneumoperitoneum.  Vascular: No acute abnormality.  OSSEOUS: No acute abnormalities.  These results were called by telephone at the time of interpretation on 03/05/2014 at 6:07 am to Dr. Loren Racer , who verbally acknowledged these results.  IMPRESSION: Suppurative appendicitis. There may be early  necrosis of the mid portion, but no gross perforation or abscess.   Electronically Signed   By: Tiburcio Pea M.D.   On: 03/05/2014 06:07   Dg Chest Port 1 View  03/12/2014   CLINICAL DATA:  53 year old with persistent hypoxia. History of perforated appendicitis.  EXAM: PORTABLE CHEST - 1 VIEW  COMPARISON:  03/11/2014 and chest CT 02/1914  FINDINGS: There are persistent streaky densities at both lung bases. Findings are most compatible with subsegmental atelectasis. Overall, there are low lung volumes. Upper lungs remain clear. Heart size is grossly stable. The trachea is midline. Negative for a pneumothorax.  IMPRESSION: Persistent low lung volumes with bibasilar opacities. Findings are most compatible with atelectasis.   Electronically Signed   By: Richarda Overlie M.D.   On: 03/12/2014 07:51   Dg Chest Port 1 View  03/11/2014   CLINICAL DATA:  Respiratory failure  EXAM: PORTABLE CHEST - 1 VIEW  COMPARISON:  CTA chest dated 03/10/2014  FINDINGS: Low lung volumes. Mild patchy bibasilar opacities, right greater than left, likely atelectasis. Suspected small left pleural effusion. No pneumothorax.  Heart is top-normal in size for inspiration.  IMPRESSION: Low lung volumes.  Patchy bilateral lower lobe opacities, likely atelectasis.  Suspected small left pleural effusion.   Electronically Signed   By: Charline Bills M.D.   On: 03/11/2014 07:55   Dg Chest Port 1 View  03/07/2014   CLINICAL DATA:  Hypoxia.  Atelectasis.  EXAM: PORTABLE CHEST - 1 VIEW  COMPARISON:  03/06/2014.  FINDINGS: Mediastinum hilar structures normal. Stable cardiomegaly with normal pulmonary vascularity. Interim partial clearing of bibasilar dense atelectasis with  prominent residual in the left lung base. Left lower lobe pneumonia cannot be excluded. Small left pleural effusion cannot be excluded. No pneumothorax. No acute bony abnormality .  IMPRESSION: 1. Low lung volumes with basilar atelectasis. There has been partial clearing from  prior exam. Left base atelectasis and/or possible pneumonia is particularly prominent. Small left pleural effusion. 2. Stable cardiomegaly, no CHF.   Electronically Signed   By: Maisie Fus  Register   On: 03/07/2014 14:17   Dg Chest Port 1 View  03/06/2014   CLINICAL DATA:  Shortness of breath on exertion.  Initial encounter.  EXAM: PORTABLE CHEST - 1 VIEW  COMPARISON:  None.  FINDINGS: Very low lung volumes with bibasilar lung opacities, plate like on the right. Atelectasis was seen on preceding abdominal CT. No evidence for edema, effusion, or pneumothorax. Normal heart size and mediastinal contours when considering technical factors.  IMPRESSION: Low lung volumes with bibasilar atelectasis, increased from CT 03/05/2014.   Electronically Signed   By: Tiburcio Pea M.D.   On: 03/06/2014 06:37    Microbiology: No results found for this or any previous visit (from the past 240 hour(s)).   Labs: Basic Metabolic Panel:  Recent Labs Lab 03/09/14 0551 03/09/14 1008 03/10/14 0005 03/10/14 0337 03/11/14 0346 03/12/14 0152 03/13/14 0453  NA  --  135* 137  --  137 135* 132*  K  --  3.2* 3.4*  --  3.3* 3.5* 4.1  CL  --  92* 93*  --  94* 95* 96  CO2  --  28 27  --  27 26 23   GLUCOSE  --  100* 91  --  107* 101* 128*  BUN  --  11 11  --  12 10 10   CREATININE  --  0.76 0.73  --  0.82 0.85 0.83  CALCIUM  --  9.1 9.0  --  9.2 8.8 9.1  MG 2.0 2.0  --  2.0 2.1 2.0 2.2  PHOS 3.1  --   --  2.9 3.2 3.3 3.1   Liver Function Tests:  Recent Labs Lab 03/07/14 1429  AST 15  ALT 19  ALKPHOS 71  BILITOT 0.5  PROT 7.3  ALBUMIN 3.4*   No results found for this basename: LIPASE, AMYLASE,  in the last 168 hours No results found for this basename: AMMONIA,  in the last 168 hours CBC:  Recent Labs Lab 03/07/14 1429 03/09/14 1008 03/11/14 0346 03/12/14 1405 03/13/14 0453  WBC 11.4* 7.0 11.1* 11.1* 10.7*  HGB 11.9* 13.1 13.2 13.6 13.6  HCT 34.9* 38.1* 38.4* 38.8* 38.4*  MCV 92.6 90.7 89.9 89.4  87.7  PLT 217 246 267 251 299   Cardiac Enzymes:  Recent Labs Lab 03/07/14 1429 03/09/14 1008 03/09/14 1554 03/09/14 2114  TROPONINI <0.30 <0.30 <0.30 <0.30   BNP: BNP (last 3 results)  Recent Labs  03/09/14 0551 03/11/14 0346  PROBNP 168.2* 86.2   CBG:  Recent Labs Lab 03/10/14 0105  GLUCAP 88    Principal Problem:   Acute fulminating appendicitis with perforation and peritonitis s/p lap appendectomy 03/05/2014 Active Problems:   Obesity (BMI 30-39.9)   Meckel diverticulum - asymptomatic   OSA (obstructive sleep apnea)   Suppurative appendicitis   Acute respiratory failure with hypoxia   Atrial fibrillation   Time coordinating discharge: <30 mins  Signed:  Kongmeng Santoro, ANP-BC

## 2014-05-26 ENCOUNTER — Other Ambulatory Visit: Payer: Self-pay | Admitting: Specialist

## 2015-10-24 IMAGING — DX DG CHEST 1V PORT
1 series · 1 of 1 positions shown · non-contrast
Comparison: None.

CLINICAL DATA: Shortness of breath on exertion.  Initial encounter.

EXAM:
PORTABLE CHEST - 1 VIEW

[chest ap]
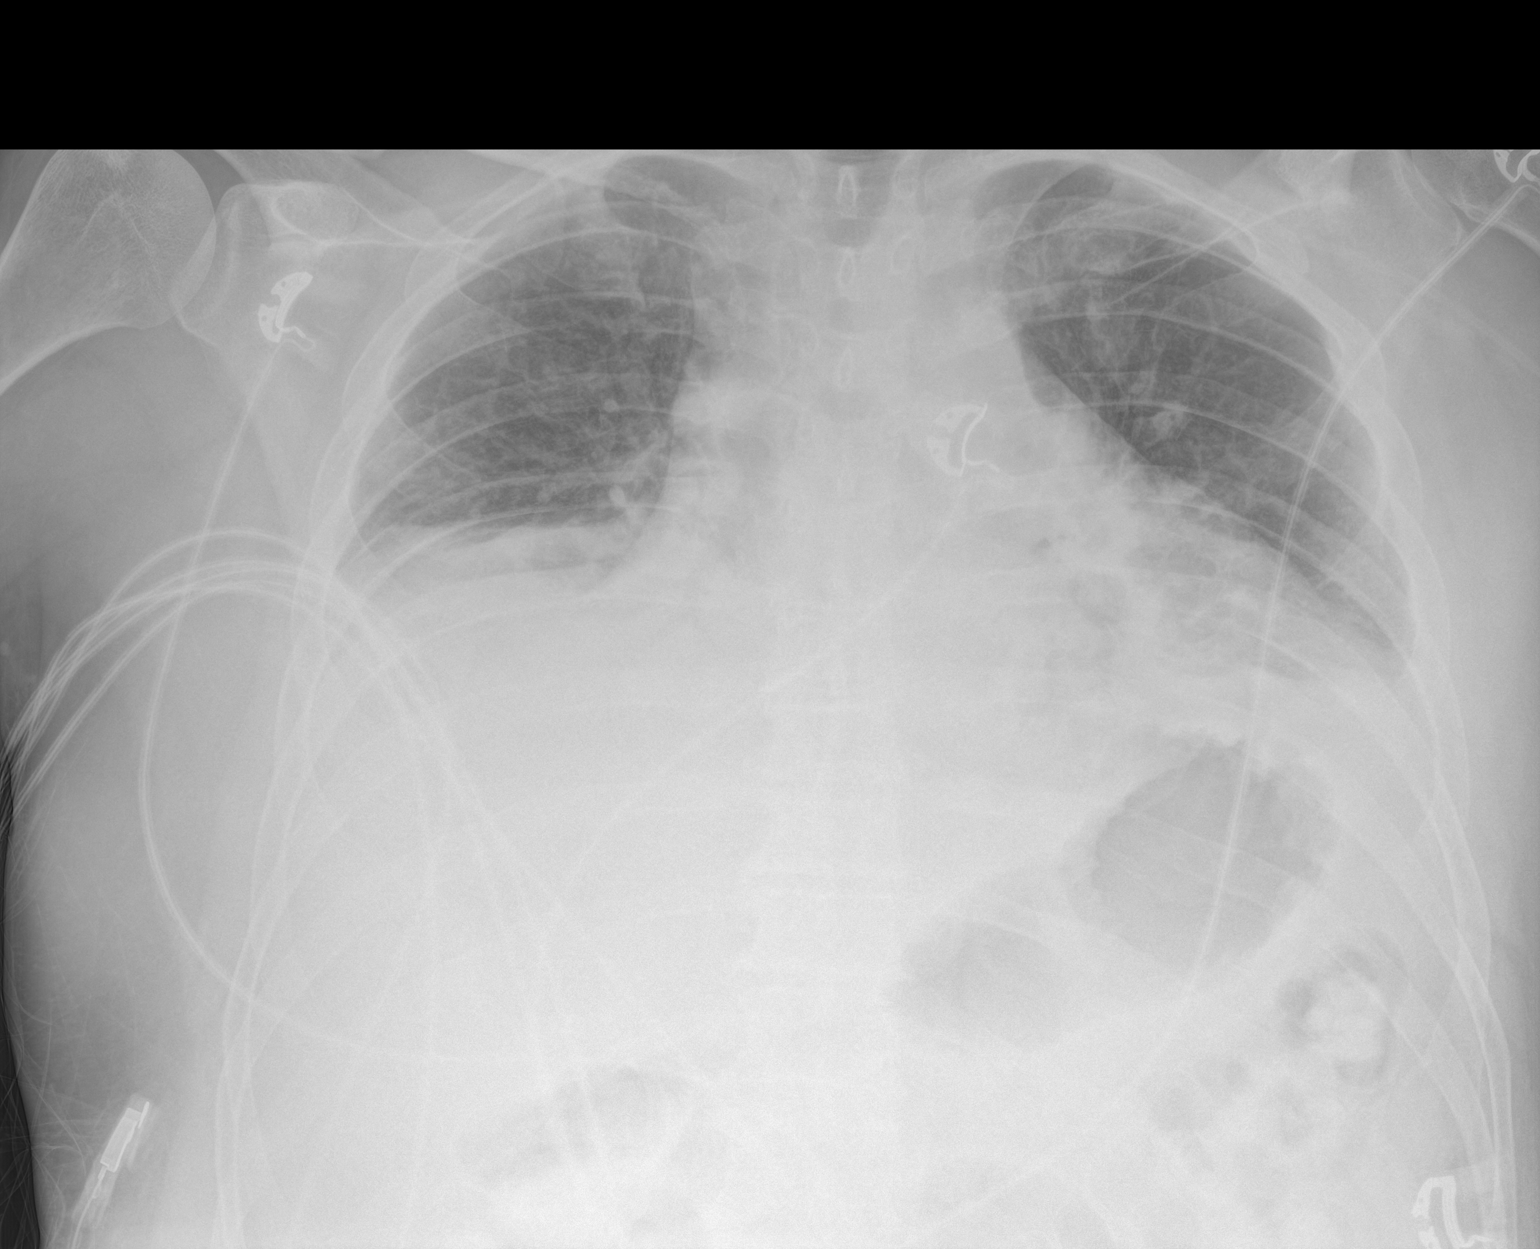

[1 of 1 positions shown; findings below may reference images not displayed]

FINDINGS: Very low lung volumes with bibasilar lung opacities, plate like on
the right. Atelectasis was seen on preceding abdominal CT. No
evidence for edema, effusion, or pneumothorax. Normal heart size and
mediastinal contours when considering technical factors.
IMPRESSION: Low lung volumes with bibasilar atelectasis, increased from CT
03/05/2014.

## 2015-10-25 IMAGING — CR DG CHEST 1V PORT
1 series · 1 of 1 positions shown · non-contrast
Comparison: 03/06/2014.

CLINICAL DATA: Hypoxia.  Atelectasis.

EXAM:
PORTABLE CHEST - 1 VIEW

[AP]
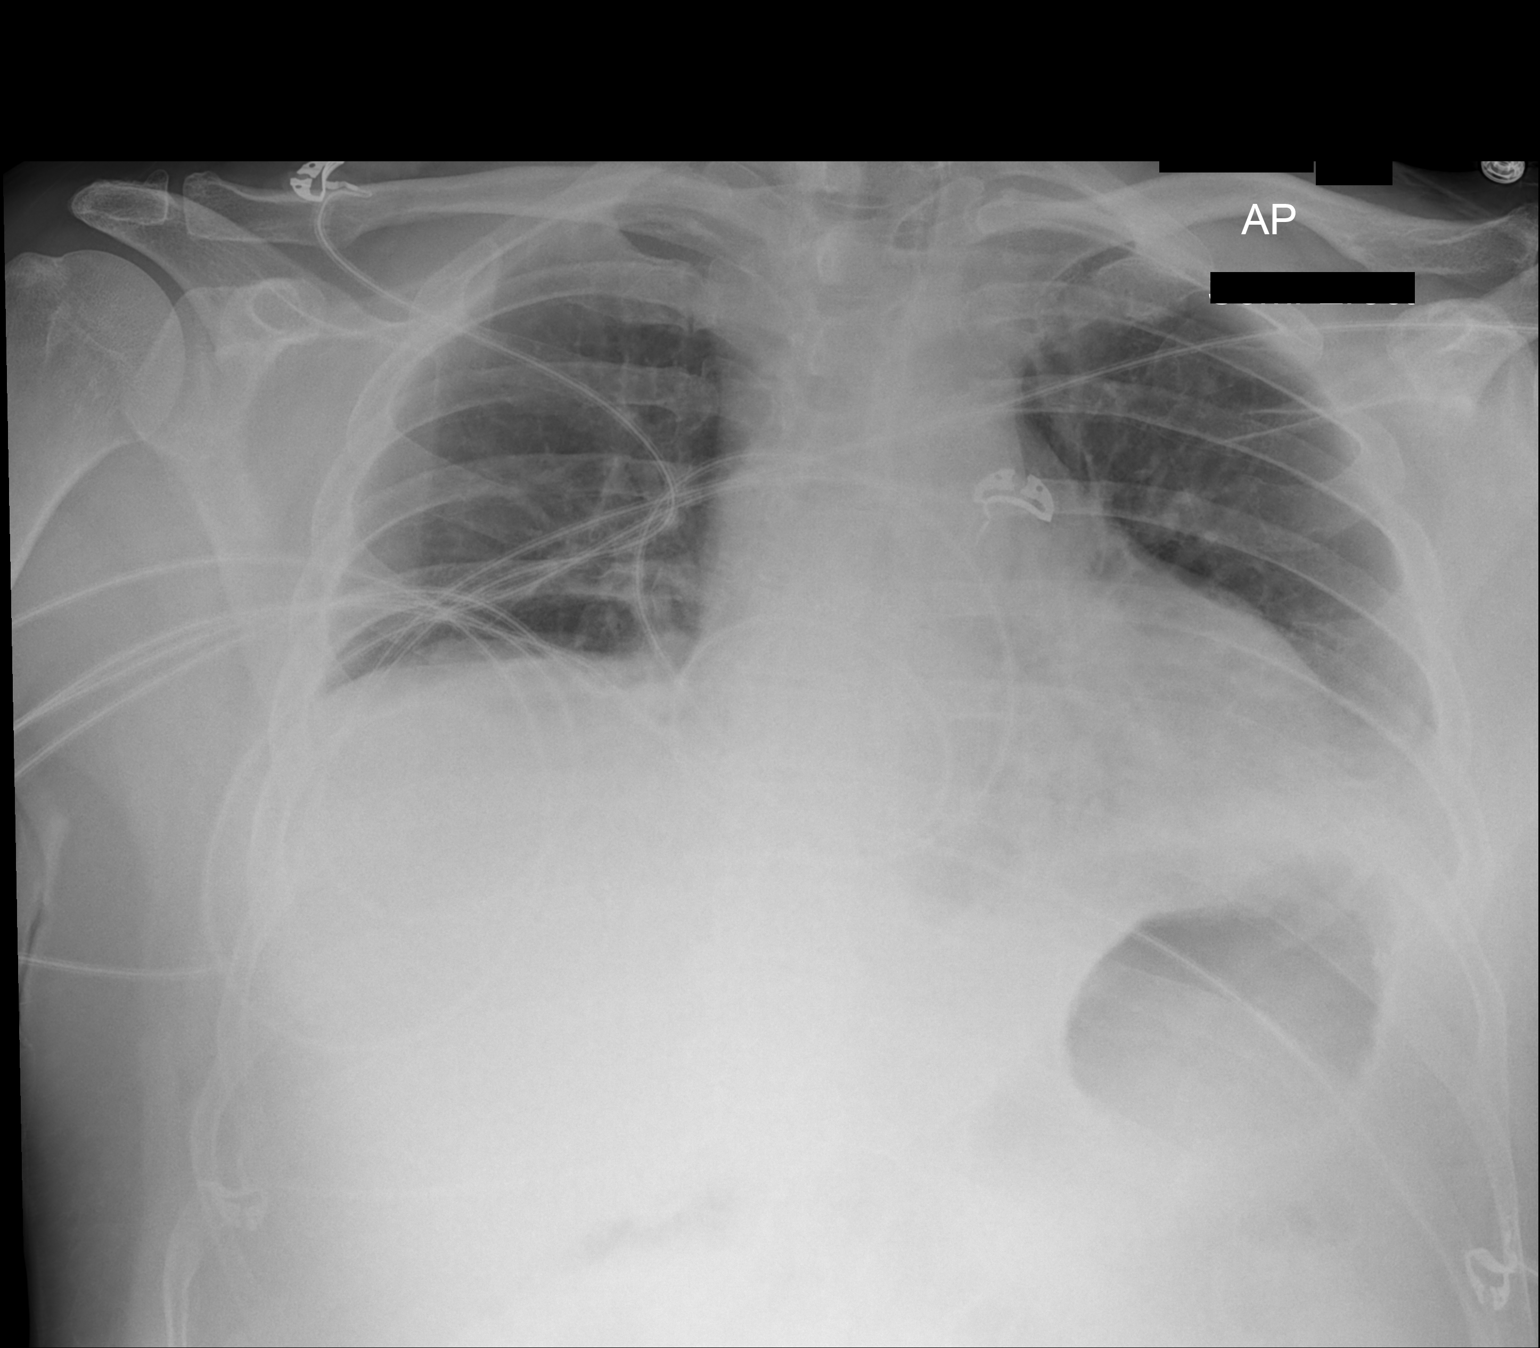

[1 of 1 positions shown; findings below may reference images not displayed]

FINDINGS: Mediastinum hilar structures normal. Stable cardiomegaly with normal
pulmonary vascularity. Interim partial clearing of bibasilar dense
atelectasis with prominent residual in the left lung base. Left
lower lobe pneumonia cannot be excluded. Small left pleural effusion
cannot be excluded. No pneumothorax. No acute bony abnormality .
IMPRESSION: 1. Low lung volumes with basilar atelectasis. There has been partial
clearing from prior exam. Left base atelectasis and/or possible
pneumonia is particularly prominent. Small left pleural effusion.
2. Stable cardiomegaly, no CHF.

## 2015-10-28 IMAGING — CT CT ANGIO CHEST
2 of 6 series · 19 of 36 positions shown · IV contrast (OMNIPAQUE)
Comparison: None.

CLINICAL DATA: Acute respiratory failure, oxygen desaturation,
acute shortness of breath

EXAM:
CT ANGIOGRAPHY CHEST WITH CONTRAST
TECHNIQUE: Multidetector CT imaging of the chest was performed using the
standard protocol during bolus administration of intravenous
contrast. Multiplanar CT image reconstructions and MIPs were
obtained to evaluate the vascular anatomy.
CONTRAST:  100mL OMNIPAQUE IOHEXOL 350 MG/ML SOLN

[Series 6: pe thins @ 1mm · axial · 0.70mm/px · z∈[+1084,+1292]mm · 18 of 232 slices shown]
[im 12/232  lung]
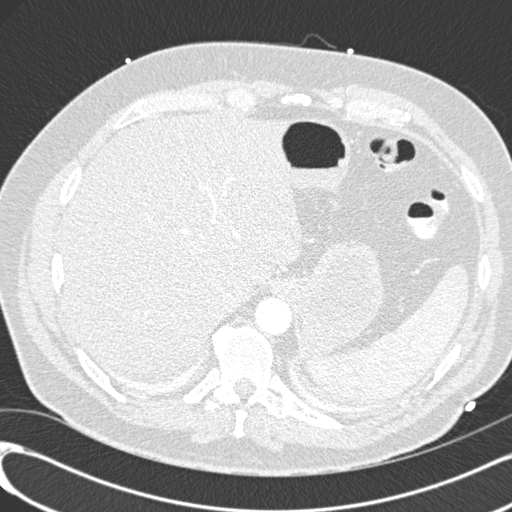
[im 24/232  mediastinal]
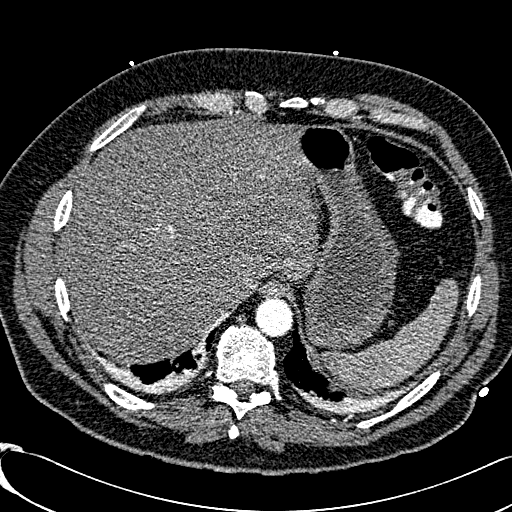
[im 35/232  lung]
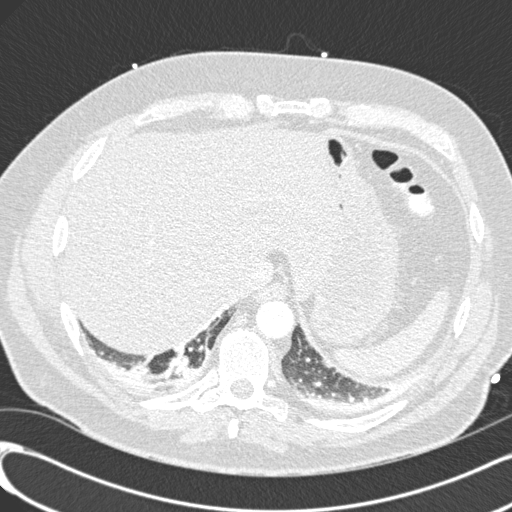
[im 47/232  mediastinal]
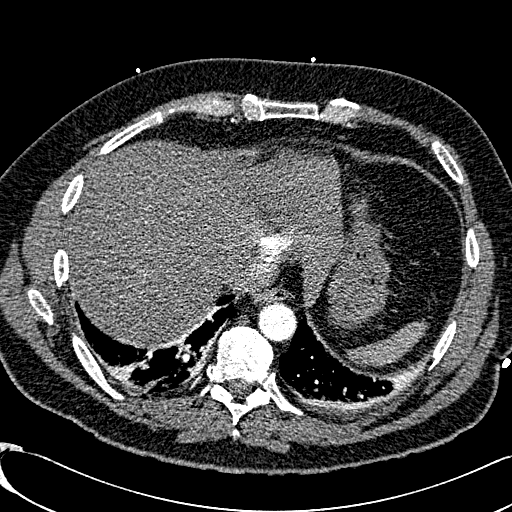
[im 58/232  lung]
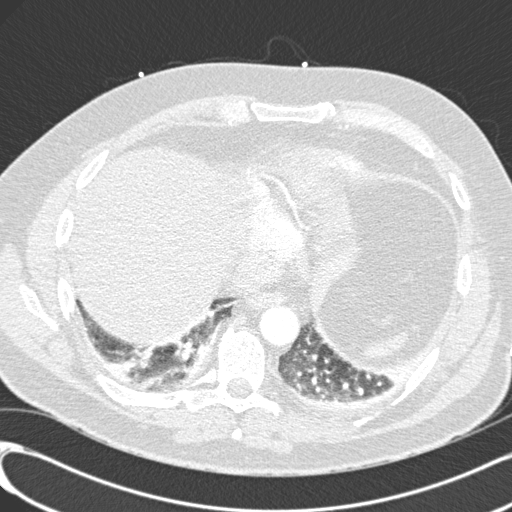
[im 70/232  mediastinal]
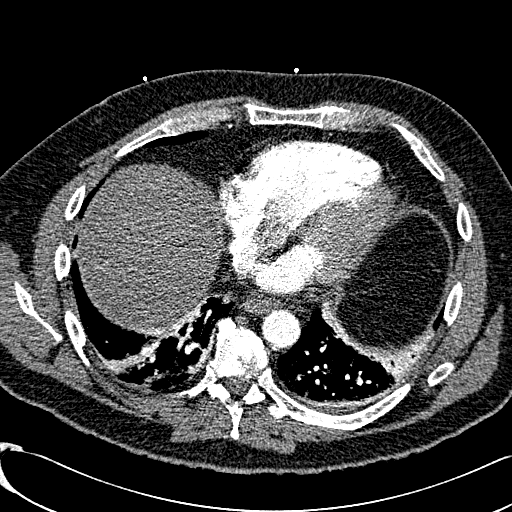
[im 81/232  lung]
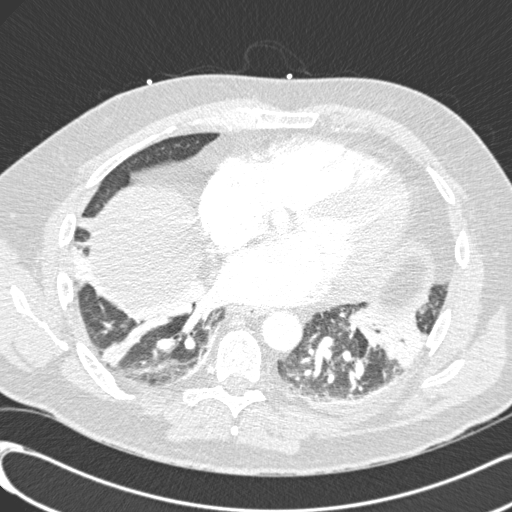
[im 93/232  mediastinal]
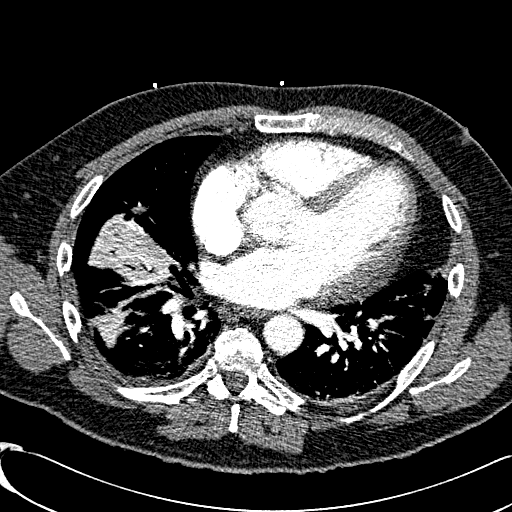
[im 104/232  lung]
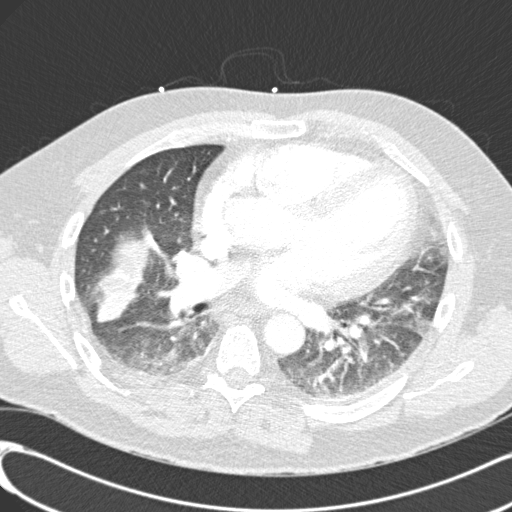
[im 128/232  mediastinal]
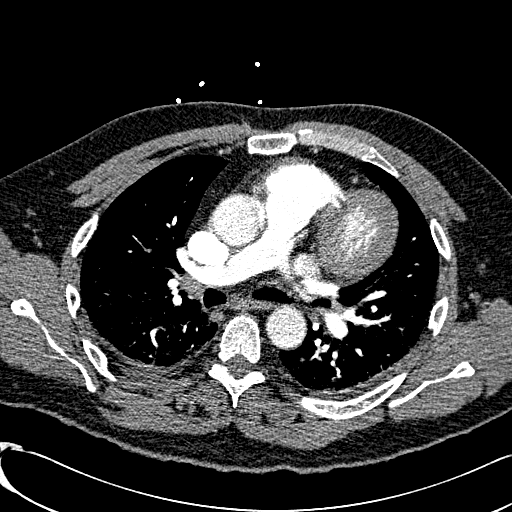
[im 139/232  lung]
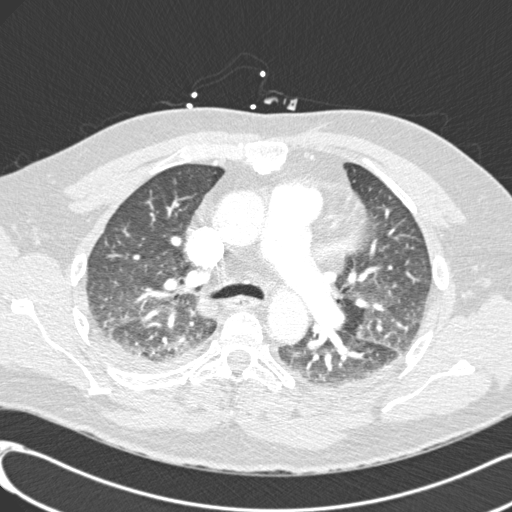
[im 151/232  mediastinal]
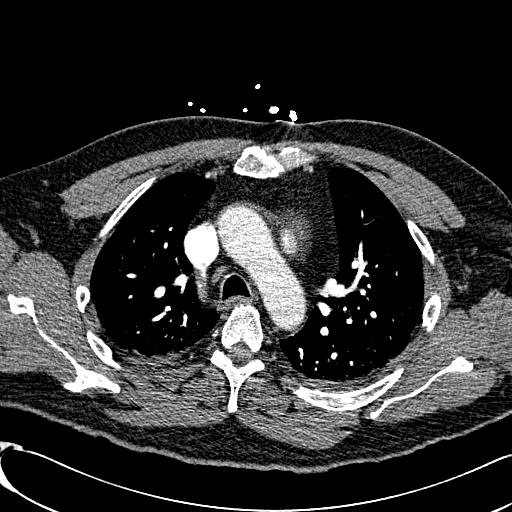
[im 162/232  lung]
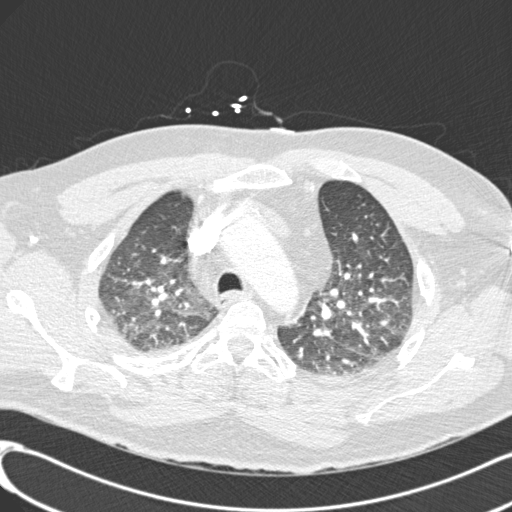
[im 174/232  mediastinal]
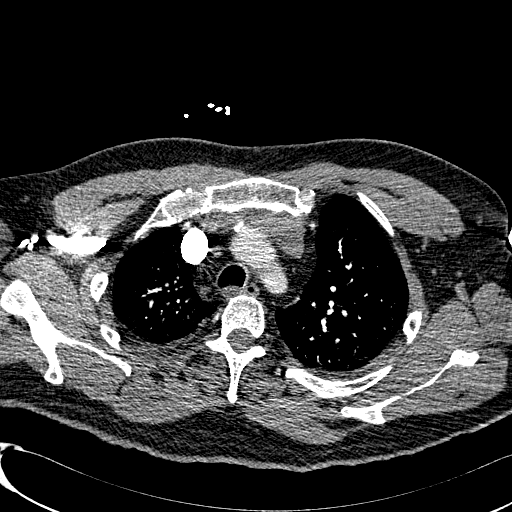
[im 185/232  lung]
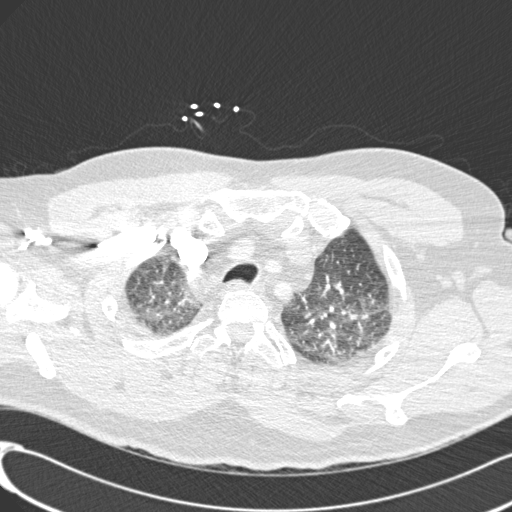
[im 197/232  mediastinal]
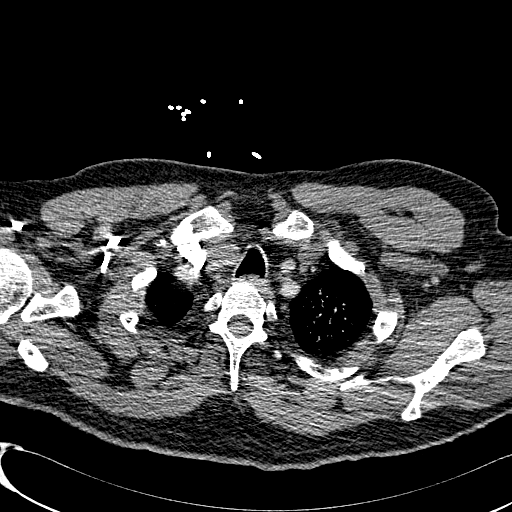
[im 208/232  lung]
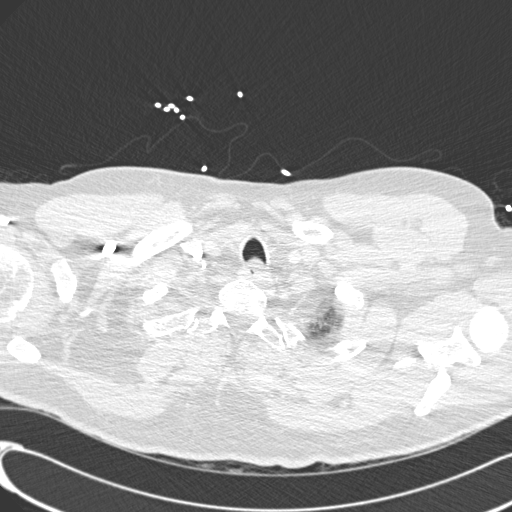
[im 220/232  mediastinal]
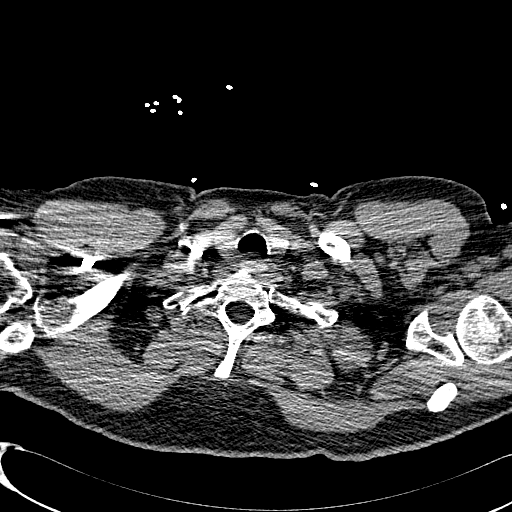

[Series 602: <mpr thick range> · coronal · 0.70mm/px · 1 of 77 slices shown]
[im 39/77  mediastinal]
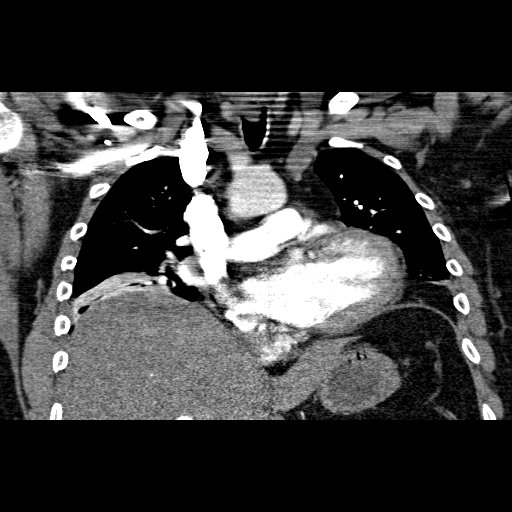

[19 of 36 positions shown; findings below may reference images not displayed]

FINDINGS: Aorta normal caliber without aneurysm or dissection.

Heart appears mildly enlarged.

Atherosclerotic calcifications at coronary arteries.

Assessment of lower lobes degraded by motion artifacts and
atelectasis.

Pulmonary arteries appear grossly patent.

No evidence of pulmonary embolism.

Scattered bibasilar atelectasis greater in RIGHT middle and RIGHT
lower lobes.

Minimal increased attenuation centrally within the upper lobes could
reflect minimal infection or edema.

No pleural effusion or pneumothorax.

No acute osseous findings.

Review of the MIP images confirms the above findings.
IMPRESSION: No evidence of pulmonary embolism.

Bibasilar atelectasis greater on RIGHT.

Question infiltrate centrally in upper lobes bilaterally, could
reflect minimal edema or infection.

## 2016-06-10 DIAGNOSIS — F4323 Adjustment disorder with mixed anxiety and depressed mood: Secondary | ICD-10-CM | POA: Diagnosis not present

## 2016-06-23 DIAGNOSIS — R030 Elevated blood-pressure reading, without diagnosis of hypertension: Secondary | ICD-10-CM | POA: Diagnosis not present

## 2016-06-23 DIAGNOSIS — Z23 Encounter for immunization: Secondary | ICD-10-CM | POA: Diagnosis not present

## 2016-06-23 DIAGNOSIS — Z0000 Encounter for general adult medical examination without abnormal findings: Secondary | ICD-10-CM | POA: Diagnosis not present

## 2016-07-14 DIAGNOSIS — I10 Essential (primary) hypertension: Secondary | ICD-10-CM | POA: Diagnosis not present

## 2016-07-14 DIAGNOSIS — M109 Gout, unspecified: Secondary | ICD-10-CM | POA: Diagnosis not present

## 2016-07-14 DIAGNOSIS — F329 Major depressive disorder, single episode, unspecified: Secondary | ICD-10-CM | POA: Diagnosis not present

## 2016-07-21 DIAGNOSIS — Z1211 Encounter for screening for malignant neoplasm of colon: Secondary | ICD-10-CM | POA: Diagnosis not present

## 2016-07-21 DIAGNOSIS — Z01818 Encounter for other preprocedural examination: Secondary | ICD-10-CM | POA: Diagnosis not present

## 2017-01-11 DIAGNOSIS — F4323 Adjustment disorder with mixed anxiety and depressed mood: Secondary | ICD-10-CM | POA: Diagnosis not present

## 2017-02-01 DIAGNOSIS — F4323 Adjustment disorder with mixed anxiety and depressed mood: Secondary | ICD-10-CM | POA: Diagnosis not present

## 2017-02-15 DIAGNOSIS — F4323 Adjustment disorder with mixed anxiety and depressed mood: Secondary | ICD-10-CM | POA: Diagnosis not present

## 2017-03-10 DIAGNOSIS — F4323 Adjustment disorder with mixed anxiety and depressed mood: Secondary | ICD-10-CM | POA: Diagnosis not present

## 2017-08-15 DIAGNOSIS — G4733 Obstructive sleep apnea (adult) (pediatric): Secondary | ICD-10-CM | POA: Diagnosis not present

## 2017-08-15 DIAGNOSIS — F329 Major depressive disorder, single episode, unspecified: Secondary | ICD-10-CM | POA: Diagnosis not present

## 2017-08-15 DIAGNOSIS — I10 Essential (primary) hypertension: Secondary | ICD-10-CM | POA: Diagnosis not present

## 2017-08-15 DIAGNOSIS — Z0000 Encounter for general adult medical examination without abnormal findings: Secondary | ICD-10-CM | POA: Diagnosis not present

## 2017-12-04 DIAGNOSIS — F4323 Adjustment disorder with mixed anxiety and depressed mood: Secondary | ICD-10-CM | POA: Diagnosis not present

## 2017-12-15 DIAGNOSIS — F4323 Adjustment disorder with mixed anxiety and depressed mood: Secondary | ICD-10-CM | POA: Diagnosis not present

## 2017-12-25 DIAGNOSIS — F4323 Adjustment disorder with mixed anxiety and depressed mood: Secondary | ICD-10-CM | POA: Diagnosis not present

## 2018-01-15 DIAGNOSIS — F4323 Adjustment disorder with mixed anxiety and depressed mood: Secondary | ICD-10-CM | POA: Diagnosis not present

## 2018-02-16 DIAGNOSIS — F4323 Adjustment disorder with mixed anxiety and depressed mood: Secondary | ICD-10-CM | POA: Diagnosis not present

## 2018-03-10 ENCOUNTER — Emergency Department (HOSPITAL_COMMUNITY): Payer: BLUE CROSS/BLUE SHIELD

## 2018-03-10 ENCOUNTER — Emergency Department (HOSPITAL_COMMUNITY)
Admission: EM | Admit: 2018-03-10 | Discharge: 2018-03-10 | Disposition: A | Discharge status: Home / Self Care | Payer: BLUE CROSS/BLUE SHIELD | Attending: Emergency Medicine | Admitting: Emergency Medicine

## 2018-03-10 ENCOUNTER — Encounter (HOSPITAL_COMMUNITY): Payer: Self-pay | Admitting: Emergency Medicine

## 2018-03-10 DIAGNOSIS — S299XXA Unspecified injury of thorax, initial encounter: Secondary | ICD-10-CM | POA: Diagnosis not present

## 2018-03-10 DIAGNOSIS — F10929 Alcohol use, unspecified with intoxication, unspecified: Secondary | ICD-10-CM | POA: Diagnosis not present

## 2018-03-10 DIAGNOSIS — R51 Headache: Secondary | ICD-10-CM | POA: Diagnosis not present

## 2018-03-10 DIAGNOSIS — R410 Disorientation, unspecified: Secondary | ICD-10-CM | POA: Diagnosis present

## 2018-03-10 DIAGNOSIS — Z79899 Other long term (current) drug therapy: Secondary | ICD-10-CM | POA: Diagnosis not present

## 2018-03-10 DIAGNOSIS — I10 Essential (primary) hypertension: Secondary | ICD-10-CM | POA: Diagnosis not present

## 2018-03-10 DIAGNOSIS — F10920 Alcohol use, unspecified with intoxication, uncomplicated: Secondary | ICD-10-CM

## 2018-03-10 DIAGNOSIS — S0990XA Unspecified injury of head, initial encounter: Secondary | ICD-10-CM | POA: Diagnosis not present

## 2018-03-10 LAB — I-STAT TROPONIN, ED: Troponin i, poc: 0.01 ng/mL (ref 0.00–0.08)

## 2018-03-10 LAB — CBC
HCT: 49.5 % (ref 39.0–52.0)
HEMOGLOBIN: 16.7 g/dL (ref 13.0–17.0)
MCH: 30.8 pg (ref 26.0–34.0)
MCHC: 33.7 g/dL (ref 30.0–36.0)
MCV: 91.3 fL (ref 80.0–100.0)
Platelets: 234 10*3/uL (ref 150–400)
RBC: 5.42 MIL/uL (ref 4.22–5.81)
RDW: 12.9 % (ref 11.5–15.5)
WBC: 11.3 10*3/uL — ABNORMAL HIGH (ref 4.0–10.5)
nRBC: 0 % (ref 0.0–0.2)

## 2018-03-10 LAB — COMPREHENSIVE METABOLIC PANEL
ALT: 42 U/L (ref 0–44)
AST: 40 U/L (ref 15–41)
Albumin: 4.3 g/dL (ref 3.5–5.0)
Alkaline Phosphatase: 75 U/L (ref 38–126)
Anion gap: 14 (ref 5–15)
BILIRUBIN TOTAL: 0.7 mg/dL (ref 0.3–1.2)
BUN: 19 mg/dL (ref 6–20)
CALCIUM: 9.7 mg/dL (ref 8.9–10.3)
CO2: 20 mmol/L — AB (ref 22–32)
CREATININE: 1.32 mg/dL — AB (ref 0.61–1.24)
Chloride: 103 mmol/L (ref 98–111)
GFR calc non Af Amer: 59 mL/min — ABNORMAL LOW (ref >60)
Glucose, Bld: 127 mg/dL — ABNORMAL HIGH (ref 70–99)
Potassium: 4 mmol/L (ref 3.5–5.1)
Sodium: 137 mmol/L (ref 135–145)
Total Protein: 7.7 g/dL (ref 6.5–8.1)

## 2018-03-10 LAB — ETHANOL: Alcohol, Ethyl (B): 33 mg/dL — ABNORMAL HIGH (ref <10)

## 2018-03-10 MED ORDER — SODIUM CHLORIDE 0.9 % IV BOLUS
1000.0000 mL | Freq: Once | INTRAVENOUS | Status: AC
  Administered 2018-03-10: 1000 mL via INTRAVENOUS

## 2018-03-10 NOTE — ED Notes (Signed)
Got patient on the monitor did ekg shown to er doctor patient is resting with family at bedside and call bell in reach 

## 2018-03-10 NOTE — ED Triage Notes (Signed)
Pt to ER after unwitnessed fall this morning about one hour ago with new onset altered mental status, pt is answering questions appropriately at this time, wife states he is having trouble with his memory. Ambulatory. No neuro deficits. Pupils 5mm equal and reactive. Pt reports having 2 glasses of wine today due to being "depressed," denies SI/HI. Pt reports he fell from standing striking his head, unsure if he passed out. No anticoagulation in use.

## 2018-03-10 NOTE — ED Provider Notes (Signed)
[facility] EMERGENCY DEPARTMENT Provider Note   CSN: [id number] Arrival date & time: 03/10/18  1235     History   Chief Complaint Chief Complaint  Patient presents with  . Fall    HPI Ernest Riley is a 57 y.o. male with medical history significant for hypertension, gout, depression, obstructive sleep apnea and alcohol use presenting for evaluation of an unwitnessed fall.  He states that around 12:30 this afternoon he drank " 2 bottles of wine" after he began feeling anxious.  Following the alcohol intake he took his blood pressure medication, laid down for a little and subsequently noticed that he was on the floor.  Per his wife patient was disoriented and stated that " felt lost."  Per patient, he denies chest pain, palpitation, shortness of breath, diaphoresis rigors shaking of upper or lower remedies, incontinence, facial flushing, weakness or focal neurological deficits, cough pain, back pain, fevers, chills, nausea, vomiting, dysarthria or aphasia. The wife does report that he appeared confused until they reported to the emergency department.   Per different report by ED nurse, patient admitted that he usually drinks out of a " carton."   Past Medical History:  Diagnosis Date  . Depression     Patient Active Problem List   Diagnosis Date Noted  . Atrial fibrillation (HCC) 03/09/2014  . Acute respiratory failure with hypoxia (HCC) 03/08/2014  . OSA (obstructive sleep apnea) 03/06/2014  . Suppurative appendicitis 03/06/2014  . Acute fulminating appendicitis with perforation and peritonitis s/p lap appendectomy 03/05/2014 03/05/2014  . Obesity (BMI 30-39.9) 03/05/2014  . Meckel diverticulum - asymptomatic 03/05/2014    Past Surgical History:  Procedure Laterality Date  . LAPAROSCOPIC APPENDECTOMY N/A 03/05/2014   Procedure: APPENDECTOMY LAPAROSCOPIC;  Surgeon: Karie Soda, MD;  Location: [facility];  Service: General;  Laterality: N/A;  . UMBILICAL  HERNIA REPAIR  07/18/2006   Dr. [name]        Home Medications    Prior to Admission medications   Medication Sig Start Date End Date Taking? Authorizing Provider  acetaminophen (TYLENOL) 500 MG tablet Take 2 tablets (1,000 mg total) by mouth 3 (three) times daily. 03/13/14  Yes Riebock, Emina, NP  allopurinol (ZYLOPRIM) 300 MG tablet Take 300 mg by mouth daily. 12/16/17  Yes [provider]  citalopram (CELEXA) 20 MG tablet Take 20 mg by mouth daily.  01/02/14  Yes [provider]  lisinopril-hydrochlorothiazide (PRINZIDE,ZESTORETIC) 10-12.5 MG tablet Take 1 tablet by mouth daily.   Yes [provider]  oxyCODONE (OXY IR/ROXICODONE) 5 MG immediate release tablet Take 1-2 tablets (5-10 mg total) by mouth every 6 (six) hours as needed for moderate pain, severe pain or breakthrough pain. Patient not taking: Reported on 03/10/2018 03/13/14   Ashok Norris, NP    Family History History reviewed. No pertinent family history.  Social History Social History   Tobacco Use  . Smoking status: Never Smoker  . Smokeless tobacco: Never Used  Substance Use Topics  . Alcohol use: Yes    Comment: rarely  . Drug use: No     Allergies   Sulfa antibiotics   Review of Systems Review of Systems  Constitutional: Negative.   HENT: Negative.   Eyes: Negative.   Respiratory: Negative.   Cardiovascular: Negative.   Gastrointestinal: Negative.   Endocrine: Negative.   Genitourinary: Negative.   Musculoskeletal: Negative.   Skin: Negative.   Neurological: Syncope: unwitness fall without LOC.  Psychiatric/Behavioral: Negative.      Physical Exam  Updated Vital Signs BP 131/87   Pulse 99   Temp 98.3 F (36.8 C) (Oral)   Resp 13   SpO2 93%   Physical Exam  Constitutional: He is oriented to person, place, and time. He appears well-developed and well-nourished.  HENT:  Head: Normocephalic and atraumatic.  Eyes: Conjunctivae are normal. Pupils are unequal.    Neck: Normal range of motion. Neck supple.  Cardiovascular: S1 normal, S2 normal and normal heart sounds. Tachycardia present.  Musculoskeletal: Normal range of motion.  Neurological: He is alert and oriented to person, place, and time. He has normal strength. He displays no tremor. No cranial nerve deficit or sensory deficit. He displays no seizure activity. Coordination normal.  Cranial nerve II-XII intact with no evidence of sensory or focal neurological deficits  Skin: Skin is warm.  Psychiatric: He has a normal mood and affect.     ED Treatments / Results  Labs (all labs ordered are listed, but only abnormal results are displayed) Labs Reviewed  CBC - Abnormal; Notable for the following components:      Result Value   WBC 11.3 (*)    All other components within normal limits  COMPREHENSIVE METABOLIC PANEL - Abnormal; Notable for the following components:   CO2 20 (*)    Glucose, Bld 127 (*)    Creatinine, Ser 1.32 (*)    GFR calc non Af Amer 59 (*)    All other components within normal limits  ETHANOL - Abnormal; Notable for the following components:   Alcohol, Ethyl (B) 33 (*)    All other components within normal limits  I-STAT TROPONIN, ED    EKG EKG Interpretation  Date/Time:  Saturday March 10 2018 14:22:15 EDT Ventricular Rate:  108 PR Interval:    QRS Duration: 92 QT Interval:  345 QTC Calculation: 463 R Axis:   -16 Text Interpretation:  Sinus tachycardia Abnormal R-wave progression, late transition Left ventricular hypertrophy Baseline wander in lead(s) V3 V4 No significant change since last tracing Confirmed by Gwyneth Sprout ([id number]) on 03/10/2018 4:18:21 PM   Radiology Ct Head Wo Contrast  Result Date: 03/10/2018 CLINICAL DATA:  Head trauma, fall, headache EXAM: CT HEAD WITHOUT CONTRAST TECHNIQUE: Contiguous axial images were obtained from the base of the skull through the vertex without intravenous contrast. COMPARISON:  None. FINDINGS: Brain: No  evidence of acute infarction, hemorrhage, hydrocephalus, extra-axial collection or mass lesion/mass effect. Vascular: No hyperdense vessel or unexpected calcification. Skull: Normal. Negative for fracture or focal lesion. Sinuses/Orbits: Chronic left maxillary sinus disease. Other sinuses clear. Orbits unremarkable. Other: None. IMPRESSION: Normal head CT without contrast for age Chronic left maxillary sinus disease Electronically Signed   By: Judie Petit.  Shick M.D.   On: 03/10/2018 13:35   Dg Chest Portable 1 View  Result Date: 03/10/2018 CLINICAL DATA:  57 year old male with a history of fall EXAM: PORTABLE CHEST 1 VIEW COMPARISON:  03/12/2014 FINDINGS: Cardiomediastinal silhouette unchanged in size and contour. Improved aeration compared to the prior. No pneumothorax or pleural effusion. No confluent airspace disease. No displaced fracture. IMPRESSION: Negative for acute cardiopulmonary disease Electronically Signed   By: Gilmer Mor D.O.   On: 03/10/2018 14:12    Procedures Procedures (including critical care time)  Medications Ordered in ED Medications  sodium chloride 0.9 % bolus 1,000 mL (0 mLs Intravenous Stopped 03/10/18 1928)     Initial Impression / Assessment and Plan / ED Course  I have reviewed the triage vital signs and the nursing notes.  Pertinent labs &  imaging results that were available during my care of the patient were reviewed by me and considered in my medical decision making (see chart for details).   57 year old gentleman with a medical history significant for hypertension, EtOH use, depression presenting after an unwitnessed fall following alcohol take this afternoon.  Both patient and the wife denies seizure-like activities.  Patient also denies chest pain, palpitation, dysrhythmia, shortness of breath, upper or lower extremity weakness, dysarthria aphasia.  The wife did report of confusion following the fall however patient was alert and oriented x4 during my assessment.   On physical exam, his cranial nerves II-XII were intact with no evidence of focal neurological or sensory deficits.   On arrival vital signs reviewed tachycardia to 117 which gradually improved to 100s, EKG confirms sinus tachycardia with no ST or T wave abnormalities, chest x-ray was unremarkable, CT head without contrast is unremarkable.  CBC showed chronic mild leukocytosis, CMP showed an AKI with elevated decreased bicarb most likely secondary to alcoholic ketosis, EtOH level of 33.   Differential diagnosis includes EtOH intoxication, orthostatic hypotension, drug drug interaction (escitalopram-EtOH), arrhythmia, seizure, PE.  He received IVF and reported significant relief.   Final Clinical Impressions(s) / ED Diagnoses   Final diagnoses:  Alcoholic intoxication without complication [facility])    ED Discharge Orders    None       Yvette Rack, MD 03/10/18 1932    Gwyneth Sprout, MD 04/12/18 1519

## 2018-03-10 NOTE — Discharge Instructions (Addendum)
Mr. Anfinson,   It was a pleasure taking care of you here in the Ed. I will advice that you decrease your alcohol intake as we think  your symptoms was most likely due alcohol.   ~Take Care Dr. Dortha Schwalbe

## 2018-03-10 NOTE — ED Notes (Signed)
Patient verbalizes understanding of discharge instructions. Opportunity for questioning and answers were provided. Armband removed by staff, pt discharged from ED.  

## 2018-03-12 NOTE — ED Provider Notes (Signed)
[facility] EMERGENCY DEPARTMENT Provider Note   CSN: [id number] Arrival date & time: 03/10/18  1235     History   Chief Complaint Chief Complaint  Patient presents with  . Fall    HPI Ernest Riley is a 57 y.o. male with medical history significant for hypertension, gout, depression, obstructive sleep apnea and alcohol use presenting for evaluation of an unwitnessed fall.  He states that around 12:30 this afternoon he drank " 2 bottles of wine" after he began feeling anxious.  Following the alcohol intake he took his blood pressure medication, laid down for a little and subsequently noticed that he was on the floor.  Per his wife patient was disoriented and stated that " felt lost."  Per patient, he denies chest pain, palpitation, shortness of breath, diaphoresis rigors shaking of upper or lower remedies, incontinence, facial flushing, weakness or focal neurological deficits, cough pain, back pain, fevers, chills, nausea, vomiting, dysarthria or aphasia. The wife does report that he appeared confused until they reported to the emergency department.   Per different report by ED nurse, patient admitted that he usually drinks out of a " carton."   Past Medical History:  Diagnosis Date  . Depression     Patient Active Problem List   Diagnosis Date Noted  . Atrial fibrillation (HCC) 03/09/2014  . Acute respiratory failure with hypoxia (HCC) 03/08/2014  . OSA (obstructive sleep apnea) 03/06/2014  . Suppurative appendicitis 03/06/2014  . Acute fulminating appendicitis with perforation and peritonitis s/p lap appendectomy 03/05/2014 03/05/2014  . Obesity (BMI 30-39.9) 03/05/2014  . Meckel diverticulum - asymptomatic 03/05/2014    Past Surgical History:  Procedure Laterality Date  . LAPAROSCOPIC APPENDECTOMY N/A 03/05/2014   Procedure: APPENDECTOMY LAPAROSCOPIC;  Surgeon: Karie Soda, MD;  Location: [facility];  Service: General;  Laterality: N/A;  . UMBILICAL  HERNIA REPAIR  07/18/2006   Dr. [name]        Home Medications    Prior to Admission medications   Medication Sig Start Date End Date Taking? Authorizing Provider  acetaminophen (TYLENOL) 500 MG tablet Take 2 tablets (1,000 mg total) by mouth 3 (three) times daily. 03/13/14  Yes Riebock, Emina, NP  allopurinol (ZYLOPRIM) 300 MG tablet Take 300 mg by mouth daily. 12/16/17  Yes [provider]  citalopram (CELEXA) 20 MG tablet Take 20 mg by mouth daily.  01/02/14  Yes [provider]  lisinopril-hydrochlorothiazide (PRINZIDE,ZESTORETIC) 10-12.5 MG tablet Take 1 tablet by mouth daily.   Yes [provider]  oxyCODONE (OXY IR/ROXICODONE) 5 MG immediate release tablet Take 1-2 tablets (5-10 mg total) by mouth every 6 (six) hours as needed for moderate pain, severe pain or breakthrough pain. Patient not taking: Reported on 03/10/2018 03/13/14   Ashok Norris, NP    Family History History reviewed. No pertinent family history.  Social History Social History   Tobacco Use  . Smoking status: Never Smoker  . Smokeless tobacco: Never Used  Substance Use Topics  . Alcohol use: Yes    Comment: rarely  . Drug use: No     Allergies   Sulfa antibiotics   Review of Systems Review of Systems  Constitutional: Negative.   HENT: Negative.   Eyes: Negative.   Respiratory: Negative.   Cardiovascular: Negative.   Gastrointestinal: Negative.   Endocrine: Negative.   Genitourinary: Negative.   Musculoskeletal: Negative.   Skin: Negative.   Neurological: Syncope: unwitness fall without LOC.  Psychiatric/Behavioral: Negative.      Physical Exam  Updated Vital Signs BP 131/87   Pulse 99   Temp 98.3 F (36.8 C) (Oral)   Resp 13   SpO2 93%   Physical Exam  Constitutional: He is oriented to person, place, and time. He appears well-developed and well-nourished.  HENT:  Head: Normocephalic and atraumatic.  Eyes: Conjunctivae are normal. Pupils are unequal.    Neck: Normal range of motion. Neck supple.  Cardiovascular: S1 normal, S2 normal and normal heart sounds. Tachycardia present.  Musculoskeletal: Normal range of motion.  Neurological: He is alert and oriented to person, place, and time. He has normal strength. He displays no tremor. No cranial nerve deficit or sensory deficit. He displays no seizure activity. Coordination normal.  Cranial nerve II-XII intact with no evidence of sensory or focal neurological deficits  Skin: Skin is warm.  Psychiatric: He has a normal mood and affect.     ED Treatments / Results  Labs (all labs ordered are listed, but only abnormal results are displayed) Labs Reviewed  CBC - Abnormal; Notable for the following components:      Result Value   WBC 11.3 (*)    All other components within normal limits  COMPREHENSIVE METABOLIC PANEL - Abnormal; Notable for the following components:   CO2 20 (*)    Glucose, Bld 127 (*)    Creatinine, Ser 1.32 (*)    GFR calc non Af Amer 59 (*)    All other components within normal limits  ETHANOL - Abnormal; Notable for the following components:   Alcohol, Ethyl (B) 33 (*)    All other components within normal limits  I-STAT TROPONIN, ED    EKG None  Radiology Ct Head Wo Contrast  Result Date: 03/10/2018 CLINICAL DATA:  Head trauma, fall, headache EXAM: CT HEAD WITHOUT CONTRAST TECHNIQUE: Contiguous axial images were obtained from the base of the skull through the vertex without intravenous contrast. COMPARISON:  None. FINDINGS: Brain: No evidence of acute infarction, hemorrhage, hydrocephalus, extra-axial collection or mass lesion/mass effect. Vascular: No hyperdense vessel or unexpected calcification. Skull: Normal. Negative for fracture or focal lesion. Sinuses/Orbits: Chronic left maxillary sinus disease. Other sinuses clear. Orbits unremarkable. Other: None. IMPRESSION: Normal head CT without contrast for age Chronic left maxillary sinus disease Electronically  Signed   By: Judie Petit.  Shick M.D.   On: 03/10/2018 13:35   Dg Chest Portable 1 View  Result Date: 03/10/2018 CLINICAL DATA:  57 year old male with a history of fall EXAM: PORTABLE CHEST 1 VIEW COMPARISON:  03/12/2014 FINDINGS: Cardiomediastinal silhouette unchanged in size and contour. Improved aeration compared to the prior. No pneumothorax or pleural effusion. No confluent airspace disease. No displaced fracture. IMPRESSION: Negative for acute cardiopulmonary disease Electronically Signed   By: Gilmer Mor D.O.   On: 03/10/2018 14:12    Procedures Procedures (including critical care time)  Medications Ordered in ED Medications  sodium chloride 0.9 % bolus 1,000 mL (0 mLs Intravenous Stopped 03/10/18 1928)     Initial Impression / Assessment and Plan / ED Course  I have reviewed the triage vital signs and the nursing notes.  Pertinent labs & imaging results that were available during my care of the patient were reviewed by me and considered in my medical decision making (see chart for details).   57 year old gentleman with a medical history significant for hypertension, EtOH use, depression presenting after an unwitnessed fall following alcohol take this afternoon.  Both patient and the wife denies seizure-like activities.  Patient also denies chest pain, palpitation, dysrhythmia, shortness  of breath, upper or lower extremity weakness, dysarthria aphasia.  The wife did report of confusion following the fall however patient was alert and oriented x4 during my assessment.  On physical exam, his cranial nerves II-XII were intact with no evidence of focal neurological or sensory deficits.   On arrival vital signs reviewed tachycardia to 117 which gradually improved to 100s, EKG confirms sinus tachycardia with no ST or T wave abnormalities, chest x-ray was unremarkable, CT head without contrast is unremarkable.  CBC showed chronic mild leukocytosis, CMP showed an AKI with elevated decreased bicarb  most likely secondary to alcoholic ketosis, EtOH level of 33.   Differential diagnosis includes EtOH intoxication, orthostatic hypotension, drug drug interaction (escitalopram-EtOH), arrhythmia, seizure, PE.  He received IVF and reported significant relief.   Final Clinical Impressions(s) / ED Diagnoses   Final diagnoses:  Alcoholic intoxication without complication [facility])    ED Discharge Orders    None       Yvette Rack, MD 03/10/18 1932    Gwyneth Sprout, MD 03/13/18 1445

## 2018-08-24 DIAGNOSIS — I10 Essential (primary) hypertension: Secondary | ICD-10-CM | POA: Diagnosis not present

## 2018-08-24 DIAGNOSIS — F329 Major depressive disorder, single episode, unspecified: Secondary | ICD-10-CM | POA: Diagnosis not present

## 2018-08-24 DIAGNOSIS — G4733 Obstructive sleep apnea (adult) (pediatric): Secondary | ICD-10-CM | POA: Diagnosis not present

## 2018-08-24 DIAGNOSIS — E7800 Pure hypercholesterolemia, unspecified: Secondary | ICD-10-CM | POA: Diagnosis not present

## 2019-05-27 DIAGNOSIS — M109 Gout, unspecified: Secondary | ICD-10-CM | POA: Diagnosis not present

## 2019-05-27 DIAGNOSIS — I10 Essential (primary) hypertension: Secondary | ICD-10-CM | POA: Diagnosis not present

## 2019-05-27 DIAGNOSIS — F3341 Major depressive disorder, recurrent, in partial remission: Secondary | ICD-10-CM | POA: Diagnosis not present

## 2019-05-27 DIAGNOSIS — G4733 Obstructive sleep apnea (adult) (pediatric): Secondary | ICD-10-CM | POA: Diagnosis not present

## 2019-09-30 DIAGNOSIS — M109 Gout, unspecified: Secondary | ICD-10-CM | POA: Diagnosis not present

## 2019-09-30 DIAGNOSIS — E781 Pure hyperglyceridemia: Secondary | ICD-10-CM | POA: Diagnosis not present

## 2019-09-30 DIAGNOSIS — R739 Hyperglycemia, unspecified: Secondary | ICD-10-CM | POA: Diagnosis not present

## 2019-09-30 DIAGNOSIS — I10 Essential (primary) hypertension: Secondary | ICD-10-CM | POA: Diagnosis not present

## 2019-09-30 DIAGNOSIS — Z0000 Encounter for general adult medical examination without abnormal findings: Secondary | ICD-10-CM | POA: Diagnosis not present

## 2020-02-03 DIAGNOSIS — E781 Pure hyperglyceridemia: Secondary | ICD-10-CM | POA: Diagnosis not present

## 2020-02-03 DIAGNOSIS — E1169 Type 2 diabetes mellitus with other specified complication: Secondary | ICD-10-CM | POA: Diagnosis not present

## 2020-02-03 DIAGNOSIS — Z6835 Body mass index (BMI) 35.0-35.9, adult: Secondary | ICD-10-CM | POA: Diagnosis not present

## 2020-02-03 DIAGNOSIS — Z23 Encounter for immunization: Secondary | ICD-10-CM | POA: Diagnosis not present

## 2020-06-08 DIAGNOSIS — I10 Essential (primary) hypertension: Secondary | ICD-10-CM | POA: Diagnosis not present

## 2020-06-08 DIAGNOSIS — E1169 Type 2 diabetes mellitus with other specified complication: Secondary | ICD-10-CM | POA: Diagnosis not present

## 2020-06-08 DIAGNOSIS — M109 Gout, unspecified: Secondary | ICD-10-CM | POA: Diagnosis not present

## 2020-12-16 DIAGNOSIS — F5102 Adjustment insomnia: Secondary | ICD-10-CM | POA: Diagnosis not present

## 2021-01-04 DIAGNOSIS — I10 Essential (primary) hypertension: Secondary | ICD-10-CM | POA: Diagnosis not present

## 2021-01-04 DIAGNOSIS — E1169 Type 2 diabetes mellitus with other specified complication: Secondary | ICD-10-CM | POA: Diagnosis not present

## 2021-01-04 DIAGNOSIS — Z125 Encounter for screening for malignant neoplasm of prostate: Secondary | ICD-10-CM | POA: Diagnosis not present

## 2021-01-04 DIAGNOSIS — Z0000 Encounter for general adult medical examination without abnormal findings: Secondary | ICD-10-CM | POA: Diagnosis not present

## 2021-07-05 DIAGNOSIS — F3341 Major depressive disorder, recurrent, in partial remission: Secondary | ICD-10-CM | POA: Diagnosis not present

## 2021-07-05 DIAGNOSIS — I10 Essential (primary) hypertension: Secondary | ICD-10-CM | POA: Diagnosis not present

## 2021-07-05 DIAGNOSIS — E1169 Type 2 diabetes mellitus with other specified complication: Secondary | ICD-10-CM | POA: Diagnosis not present

## 2021-07-05 DIAGNOSIS — E781 Pure hyperglyceridemia: Secondary | ICD-10-CM | POA: Diagnosis not present

## 2021-07-05 DIAGNOSIS — E119 Type 2 diabetes mellitus without complications: Secondary | ICD-10-CM | POA: Diagnosis not present

## 2022-01-11 DIAGNOSIS — F4323 Adjustment disorder with mixed anxiety and depressed mood: Secondary | ICD-10-CM | POA: Diagnosis not present

## 2022-01-18 DIAGNOSIS — F4323 Adjustment disorder with mixed anxiety and depressed mood: Secondary | ICD-10-CM | POA: Diagnosis not present

## 2022-01-28 DIAGNOSIS — F4323 Adjustment disorder with mixed anxiety and depressed mood: Secondary | ICD-10-CM | POA: Diagnosis not present

## 2022-02-10 DIAGNOSIS — F4323 Adjustment disorder with mixed anxiety and depressed mood: Secondary | ICD-10-CM | POA: Diagnosis not present

## 2022-02-16 DIAGNOSIS — F4323 Adjustment disorder with mixed anxiety and depressed mood: Secondary | ICD-10-CM | POA: Diagnosis not present

## 2022-03-04 DIAGNOSIS — F3341 Major depressive disorder, recurrent, in partial remission: Secondary | ICD-10-CM | POA: Diagnosis not present

## 2022-03-04 DIAGNOSIS — Z125 Encounter for screening for malignant neoplasm of prostate: Secondary | ICD-10-CM | POA: Diagnosis not present

## 2022-03-04 DIAGNOSIS — Z0000 Encounter for general adult medical examination without abnormal findings: Secondary | ICD-10-CM | POA: Diagnosis not present

## 2022-03-04 DIAGNOSIS — E781 Pure hyperglyceridemia: Secondary | ICD-10-CM | POA: Diagnosis not present

## 2022-03-04 DIAGNOSIS — E1169 Type 2 diabetes mellitus with other specified complication: Secondary | ICD-10-CM | POA: Diagnosis not present

## 2022-03-04 DIAGNOSIS — I10 Essential (primary) hypertension: Secondary | ICD-10-CM | POA: Diagnosis not present

## 2022-08-15 DIAGNOSIS — I10 Essential (primary) hypertension: Secondary | ICD-10-CM | POA: Diagnosis not present

## 2022-08-15 DIAGNOSIS — F3341 Major depressive disorder, recurrent, in partial remission: Secondary | ICD-10-CM | POA: Diagnosis not present

## 2022-08-15 DIAGNOSIS — M25551 Pain in right hip: Secondary | ICD-10-CM | POA: Diagnosis not present

## 2022-08-15 DIAGNOSIS — E1169 Type 2 diabetes mellitus with other specified complication: Secondary | ICD-10-CM | POA: Diagnosis not present

## 2023-02-22 DIAGNOSIS — L989 Disorder of the skin and subcutaneous tissue, unspecified: Secondary | ICD-10-CM | POA: Diagnosis not present

## 2023-02-22 DIAGNOSIS — B079 Viral wart, unspecified: Secondary | ICD-10-CM | POA: Diagnosis not present

## 2023-03-09 DIAGNOSIS — D408 Neoplasm of uncertain behavior of other specified male genital organs: Secondary | ICD-10-CM | POA: Diagnosis not present

## 2023-03-14 DIAGNOSIS — D408 Neoplasm of uncertain behavior of other specified male genital organs: Secondary | ICD-10-CM | POA: Diagnosis not present

## 2023-05-01 DIAGNOSIS — F4312 Post-traumatic stress disorder, chronic: Secondary | ICD-10-CM | POA: Diagnosis not present

## 2023-05-08 DIAGNOSIS — F4312 Post-traumatic stress disorder, chronic: Secondary | ICD-10-CM | POA: Diagnosis not present

## 2023-05-15 DIAGNOSIS — F4312 Post-traumatic stress disorder, chronic: Secondary | ICD-10-CM | POA: Diagnosis not present

## 2023-05-22 DIAGNOSIS — F4312 Post-traumatic stress disorder, chronic: Secondary | ICD-10-CM | POA: Diagnosis not present

## 2023-05-29 DIAGNOSIS — F4312 Post-traumatic stress disorder, chronic: Secondary | ICD-10-CM | POA: Diagnosis not present

## 2023-06-05 DIAGNOSIS — F4312 Post-traumatic stress disorder, chronic: Secondary | ICD-10-CM | POA: Diagnosis not present

## 2023-06-12 DIAGNOSIS — F4312 Post-traumatic stress disorder, chronic: Secondary | ICD-10-CM | POA: Diagnosis not present

## 2023-06-19 DIAGNOSIS — F4312 Post-traumatic stress disorder, chronic: Secondary | ICD-10-CM | POA: Diagnosis not present

## 2023-06-23 DIAGNOSIS — E781 Pure hyperglyceridemia: Secondary | ICD-10-CM | POA: Diagnosis not present

## 2023-06-23 DIAGNOSIS — F3342 Major depressive disorder, recurrent, in full remission: Secondary | ICD-10-CM | POA: Diagnosis not present

## 2023-06-23 DIAGNOSIS — E1169 Type 2 diabetes mellitus with other specified complication: Secondary | ICD-10-CM | POA: Diagnosis not present

## 2023-06-23 DIAGNOSIS — E7800 Pure hypercholesterolemia, unspecified: Secondary | ICD-10-CM | POA: Diagnosis not present

## 2023-06-23 DIAGNOSIS — I10 Essential (primary) hypertension: Secondary | ICD-10-CM | POA: Diagnosis not present

## 2023-06-26 DIAGNOSIS — F4312 Post-traumatic stress disorder, chronic: Secondary | ICD-10-CM | POA: Diagnosis not present

## 2023-06-27 DIAGNOSIS — E1169 Type 2 diabetes mellitus with other specified complication: Secondary | ICD-10-CM | POA: Diagnosis not present

## 2023-07-03 DIAGNOSIS — F4312 Post-traumatic stress disorder, chronic: Secondary | ICD-10-CM | POA: Diagnosis not present

## 2023-07-17 DIAGNOSIS — F330 Major depressive disorder, recurrent, mild: Secondary | ICD-10-CM | POA: Diagnosis not present

## 2023-07-17 DIAGNOSIS — F4312 Post-traumatic stress disorder, chronic: Secondary | ICD-10-CM | POA: Diagnosis not present

## 2023-07-24 DIAGNOSIS — F4312 Post-traumatic stress disorder, chronic: Secondary | ICD-10-CM | POA: Diagnosis not present

## 2023-07-24 DIAGNOSIS — F330 Major depressive disorder, recurrent, mild: Secondary | ICD-10-CM | POA: Diagnosis not present

## 2023-08-07 DIAGNOSIS — F330 Major depressive disorder, recurrent, mild: Secondary | ICD-10-CM | POA: Diagnosis not present

## 2023-08-07 DIAGNOSIS — F4312 Post-traumatic stress disorder, chronic: Secondary | ICD-10-CM | POA: Diagnosis not present

## 2023-08-17 DIAGNOSIS — F4312 Post-traumatic stress disorder, chronic: Secondary | ICD-10-CM | POA: Diagnosis not present

## 2023-08-17 DIAGNOSIS — F330 Major depressive disorder, recurrent, mild: Secondary | ICD-10-CM | POA: Diagnosis not present

## 2023-08-28 DIAGNOSIS — F330 Major depressive disorder, recurrent, mild: Secondary | ICD-10-CM | POA: Diagnosis not present

## 2023-09-05 DIAGNOSIS — F330 Major depressive disorder, recurrent, mild: Secondary | ICD-10-CM | POA: Diagnosis not present

## 2023-09-13 DIAGNOSIS — Z0000 Encounter for general adult medical examination without abnormal findings: Secondary | ICD-10-CM | POA: Diagnosis not present

## 2023-09-13 DIAGNOSIS — G4733 Obstructive sleep apnea (adult) (pediatric): Secondary | ICD-10-CM | POA: Diagnosis not present

## 2023-09-13 DIAGNOSIS — E1169 Type 2 diabetes mellitus with other specified complication: Secondary | ICD-10-CM | POA: Diagnosis not present

## 2023-09-13 DIAGNOSIS — F3341 Major depressive disorder, recurrent, in partial remission: Secondary | ICD-10-CM | POA: Diagnosis not present

## 2023-09-13 DIAGNOSIS — E119 Type 2 diabetes mellitus without complications: Secondary | ICD-10-CM | POA: Diagnosis not present

## 2023-09-13 DIAGNOSIS — M109 Gout, unspecified: Secondary | ICD-10-CM | POA: Diagnosis not present

## 2023-09-18 DIAGNOSIS — F330 Major depressive disorder, recurrent, mild: Secondary | ICD-10-CM | POA: Diagnosis not present

## 2023-09-18 DIAGNOSIS — F4312 Post-traumatic stress disorder, chronic: Secondary | ICD-10-CM | POA: Diagnosis not present

## 2023-09-25 DIAGNOSIS — F4312 Post-traumatic stress disorder, chronic: Secondary | ICD-10-CM | POA: Diagnosis not present

## 2023-09-25 DIAGNOSIS — F330 Major depressive disorder, recurrent, mild: Secondary | ICD-10-CM | POA: Diagnosis not present

## 2023-10-02 DIAGNOSIS — F330 Major depressive disorder, recurrent, mild: Secondary | ICD-10-CM | POA: Diagnosis not present

## 2023-10-02 DIAGNOSIS — F4312 Post-traumatic stress disorder, chronic: Secondary | ICD-10-CM | POA: Diagnosis not present

## 2023-10-10 DIAGNOSIS — F4312 Post-traumatic stress disorder, chronic: Secondary | ICD-10-CM | POA: Diagnosis not present

## 2023-10-18 DIAGNOSIS — F4312 Post-traumatic stress disorder, chronic: Secondary | ICD-10-CM | POA: Diagnosis not present

## 2023-11-01 DIAGNOSIS — F4312 Post-traumatic stress disorder, chronic: Secondary | ICD-10-CM | POA: Diagnosis not present

## 2023-11-07 DIAGNOSIS — F4312 Post-traumatic stress disorder, chronic: Secondary | ICD-10-CM | POA: Diagnosis not present

## 2023-11-14 DIAGNOSIS — F4312 Post-traumatic stress disorder, chronic: Secondary | ICD-10-CM | POA: Diagnosis not present

## 2023-11-21 DIAGNOSIS — F4312 Post-traumatic stress disorder, chronic: Secondary | ICD-10-CM | POA: Diagnosis not present

## 2023-12-01 DIAGNOSIS — F4312 Post-traumatic stress disorder, chronic: Secondary | ICD-10-CM | POA: Diagnosis not present

## 2023-12-07 DIAGNOSIS — F4312 Post-traumatic stress disorder, chronic: Secondary | ICD-10-CM | POA: Diagnosis not present

## 2023-12-12 DIAGNOSIS — F4312 Post-traumatic stress disorder, chronic: Secondary | ICD-10-CM | POA: Diagnosis not present

## 2023-12-19 DIAGNOSIS — F4312 Post-traumatic stress disorder, chronic: Secondary | ICD-10-CM | POA: Diagnosis not present

## 2024-01-02 DIAGNOSIS — F4312 Post-traumatic stress disorder, chronic: Secondary | ICD-10-CM | POA: Diagnosis not present

## 2024-01-16 DIAGNOSIS — F4312 Post-traumatic stress disorder, chronic: Secondary | ICD-10-CM | POA: Diagnosis not present

## 2024-01-24 DIAGNOSIS — F4312 Post-traumatic stress disorder, chronic: Secondary | ICD-10-CM | POA: Diagnosis not present

## 2024-02-02 DIAGNOSIS — F4312 Post-traumatic stress disorder, chronic: Secondary | ICD-10-CM | POA: Diagnosis not present

## 2024-02-07 DIAGNOSIS — F4312 Post-traumatic stress disorder, chronic: Secondary | ICD-10-CM | POA: Diagnosis not present

## 2024-02-14 DIAGNOSIS — F4312 Post-traumatic stress disorder, chronic: Secondary | ICD-10-CM | POA: Diagnosis not present

## 2024-02-22 DIAGNOSIS — F4312 Post-traumatic stress disorder, chronic: Secondary | ICD-10-CM | POA: Diagnosis not present

## 2024-03-01 DIAGNOSIS — F4312 Post-traumatic stress disorder, chronic: Secondary | ICD-10-CM | POA: Diagnosis not present

## 2024-03-07 DIAGNOSIS — F4312 Post-traumatic stress disorder, chronic: Secondary | ICD-10-CM | POA: Diagnosis not present

## 2024-03-26 DIAGNOSIS — F4312 Post-traumatic stress disorder, chronic: Secondary | ICD-10-CM | POA: Diagnosis not present

## 2024-04-16 DIAGNOSIS — F4312 Post-traumatic stress disorder, chronic: Secondary | ICD-10-CM | POA: Diagnosis not present

## 2024-04-23 DIAGNOSIS — F4312 Post-traumatic stress disorder, chronic: Secondary | ICD-10-CM | POA: Diagnosis not present

## 2024-05-02 DIAGNOSIS — F4312 Post-traumatic stress disorder, chronic: Secondary | ICD-10-CM | POA: Diagnosis not present

## 2024-05-09 DIAGNOSIS — F4312 Post-traumatic stress disorder, chronic: Secondary | ICD-10-CM | POA: Diagnosis not present

## 2024-05-21 DIAGNOSIS — F4312 Post-traumatic stress disorder, chronic: Secondary | ICD-10-CM | POA: Diagnosis not present
